# Patient Record
Sex: Female | Born: 1980 | Race: White | Hispanic: No | Marital: Single | Smoking: Former smoker
Health system: Southern US, Community
[De-identification: ages and names within clinical notes are randomized; demographics above are authoritative.]

## PROBLEM LIST (undated history)

## (undated) ENCOUNTER — Emergency Department (HOSPITAL_BASED_OUTPATIENT_CLINIC_OR_DEPARTMENT_OTHER): Admission: EM | Payer: Self-pay | Source: Home / Self Care

## (undated) DIAGNOSIS — Z789 Other specified health status: Secondary | ICD-10-CM

## (undated) HISTORY — PX: OTHER SURGICAL HISTORY: SHX169

## (undated) HISTORY — PX: CHOLECYSTECTOMY: SHX55

## (undated) HISTORY — PX: ABSCESS DRAINAGE: SHX1119

---

## 2010-06-14 ENCOUNTER — Emergency Department (HOSPITAL_COMMUNITY): Admission: EM | Admit: 2010-06-14 | Discharge: 2010-06-14 | Payer: Self-pay | Admitting: Emergency Medicine

## 2010-12-03 LAB — URINALYSIS, ROUTINE W REFLEX MICROSCOPIC
Bilirubin Urine: NEGATIVE
Hgb urine dipstick: NEGATIVE
Nitrite: NEGATIVE
Protein, ur: NEGATIVE mg/dL
Urobilinogen, UA: 1 mg/dL (ref 0.0–1.0)

## 2010-12-03 LAB — GC/CHLAMYDIA PROBE AMP, GENITAL
Chlamydia, DNA Probe: NEGATIVE
GC Probe Amp, Genital: NEGATIVE

## 2010-12-03 LAB — URINE MICROSCOPIC-ADD ON

## 2010-12-03 LAB — WET PREP, GENITAL
Trich, Wet Prep: NONE SEEN
WBC, Wet Prep HPF POC: NONE SEEN
Yeast Wet Prep HPF POC: NONE SEEN

## 2010-12-08 ENCOUNTER — Emergency Department (HOSPITAL_BASED_OUTPATIENT_CLINIC_OR_DEPARTMENT_OTHER)
Admission: EM | Admit: 2010-12-08 | Discharge: 2010-12-09 | Disposition: A | Discharge status: Home / Self Care | Payer: Self-pay | Attending: Emergency Medicine | Admitting: Emergency Medicine

## 2010-12-08 DIAGNOSIS — S01501A Unspecified open wound of lip, initial encounter: Secondary | ICD-10-CM | POA: Insufficient documentation

## 2010-12-08 DIAGNOSIS — X58XXXA Exposure to other specified factors, initial encounter: Secondary | ICD-10-CM | POA: Insufficient documentation

## 2011-01-22 ENCOUNTER — Emergency Department (HOSPITAL_BASED_OUTPATIENT_CLINIC_OR_DEPARTMENT_OTHER)
Admission: EM | Admit: 2011-01-22 | Discharge: 2011-01-22 | Discharge status: Against Medical Advice | Payer: Self-pay | Attending: Emergency Medicine | Admitting: Emergency Medicine

## 2011-01-22 DIAGNOSIS — R509 Fever, unspecified: Secondary | ICD-10-CM | POA: Insufficient documentation

## 2013-11-23 ENCOUNTER — Emergency Department (HOSPITAL_BASED_OUTPATIENT_CLINIC_OR_DEPARTMENT_OTHER)
Admission: EM | Admit: 2013-11-23 | Discharge: 2013-11-23 | Disposition: A | Discharge status: Home / Self Care | Payer: 59 | Attending: Emergency Medicine | Admitting: Emergency Medicine

## 2013-11-23 ENCOUNTER — Encounter (HOSPITAL_BASED_OUTPATIENT_CLINIC_OR_DEPARTMENT_OTHER): Payer: Self-pay | Admitting: Emergency Medicine

## 2013-11-23 DIAGNOSIS — Z3202 Encounter for pregnancy test, result negative: Secondary | ICD-10-CM | POA: Insufficient documentation

## 2013-11-23 DIAGNOSIS — F17200 Nicotine dependence, unspecified, uncomplicated: Secondary | ICD-10-CM | POA: Insufficient documentation

## 2013-11-23 DIAGNOSIS — N390 Urinary tract infection, site not specified: Secondary | ICD-10-CM | POA: Insufficient documentation

## 2013-11-23 LAB — URINALYSIS, ROUTINE W REFLEX MICROSCOPIC
Bilirubin Urine: NEGATIVE
Glucose, UA: NEGATIVE mg/dL
Hgb urine dipstick: NEGATIVE
Ketones, ur: NEGATIVE mg/dL
NITRITE: NEGATIVE
PH: 6 (ref 5.0–8.0)
Protein, ur: NEGATIVE mg/dL
SPECIFIC GRAVITY, URINE: 1.024 (ref 1.005–1.030)
UROBILINOGEN UA: 1 mg/dL (ref 0.0–1.0)

## 2013-11-23 LAB — URINE MICROSCOPIC-ADD ON

## 2013-11-23 LAB — PREGNANCY, URINE: PREG TEST UR: NEGATIVE

## 2013-11-23 MED ORDER — PHENAZOPYRIDINE HCL 200 MG PO TABS: 200.0000 mg | ORAL_TABLET | Freq: Three times a day (TID) | ORAL | Status: DC | PRN

## 2013-11-23 MED ORDER — KETOROLAC TROMETHAMINE 60 MG/2ML IM SOLN
60.0000 mg | Freq: Once | INTRAMUSCULAR | Status: AC
  Administered 2013-11-23: 60 mg via INTRAMUSCULAR
  Filled 2013-11-23: qty 2

## 2013-11-23 MED ORDER — NITROFURANTOIN MONOHYD MACRO 100 MG PO CAPS: 100.0000 mg | ORAL_CAPSULE | Freq: Two times a day (BID) | ORAL | Status: DC

## 2013-11-23 NOTE — ED Notes (Signed)
Pt states that she has had burning for greater than 1 week, thought it was related to menstral cycle, but has not improved

## 2013-11-23 NOTE — ED Provider Notes (Signed)
CSN: [id number]     Arrival date & time 11/23/13  0242 History   First MD Initiated Contact with Patient 11/23/13 0257     Chief Complaint  Patient presents with  . Abdominal Pain  . Dysuria     (Consider location/radiation/quality/duration/timing/severity/associated sxs/prior Treatment) Patient is a 33 y.o. female presenting with abdominal pain and dysuria. The history is provided by the patient.  Abdominal Pain Pain location:  Suprapubic Pain quality: burning   Pain radiates to:  Does not radiate Pain severity:  Moderate Onset quality:  Gradual Timing:  Constant Progression:  Unchanged Chronicity:  New Context: not trauma   Relieved by:  Nothing Worsened by:  Nothing tried Ineffective treatments:  None tried Associated symptoms: dysuria   Associated symptoms: no fever, no hematuria, no vaginal bleeding and no vaginal discharge   Dysuria:    Severity:  Moderate   Onset quality:  Gradual   Timing:  Constant   Progression:  Unchanged   Chronicity:  New Risk factors: pregnancy   Dysuria Associated symptoms: abdominal pain   Associated symptoms: no fever, no flank pain and no vaginal discharge   Also wants migraine treatment and ear pain treatment  History reviewed. No pertinent past medical history. History reviewed. No pertinent past surgical history. History reviewed. No pertinent family history. History  Substance Use Topics  . Smoking status: Current Every Day Smoker  . Smokeless tobacco: Not on file  . Alcohol Use: No   OB History   Grav Para Term Preterm Abortions TAB SAB Ect Mult Living                 Review of Systems  Constitutional: Negative for fever.  Gastrointestinal: Positive for abdominal pain.  Genitourinary: Positive for dysuria. Negative for hematuria, flank pain, vaginal bleeding, vaginal discharge, difficulty urinating and menstrual problem.  All other systems reviewed and are negative.      Allergies  Sulfa antibiotics  Home  Medications  No current outpatient prescriptions on file. BP 128/74  Pulse 102  Temp(Src) 99.1 F (37.3 C) (Oral)  Resp 20  Ht 5\' 4"  (1.626 m)  Wt 158 lb (71.668 kg)  BMI 27.11 kg/m2  SpO2 98%  LMP 11/16/2013 Physical Exam  Constitutional: She appears well-developed and well-nourished. No distress.  Appears intoxicated  HENT:  Head: Normocephalic and atraumatic.  Mouth/Throat: Oropharynx is clear and moist.  Eyes: Conjunctivae and EOM are normal.  Pinpoint pupils  Neck: Normal range of motion.  Cardiovascular: Normal rate, regular rhythm and intact distal pulses.   Pulmonary/Chest: Effort normal and breath sounds normal. She has no wheezes. She has no rales.  Abdominal: Soft. Bowel sounds are normal. There is no tenderness. There is no rebound and no guarding.  Musculoskeletal: Normal range of motion.  Neurological: She is alert. She has normal reflexes.  Skin: Skin is warm and dry.  Psychiatric: She has a normal mood and affect.    ED Course  Procedures (including critical care time) Labs Review Labs Reviewed  PREGNANCY, URINE  URINALYSIS, ROUTINE W REFLEX MICROSCOPIC   Imaging Review No results found.   EKG Interpretation None      MDM   Final diagnoses:  None    Will treat for UTI follow up with your family doctor    Allsion Nogales K Donyelle Enyeart-Rasch, MD 11/23/13 [phone]

## 2013-11-23 NOTE — ED Notes (Signed)
Pt requesting pain medication for migraine, informed patient that stong pain medication could not be given due to fact that she drove herself her and does not have a ride to come and pick her up

## 2014-04-21 ENCOUNTER — Encounter (HOSPITAL_BASED_OUTPATIENT_CLINIC_OR_DEPARTMENT_OTHER): Payer: Self-pay | Admitting: Emergency Medicine

## 2014-04-21 ENCOUNTER — Emergency Department (HOSPITAL_BASED_OUTPATIENT_CLINIC_OR_DEPARTMENT_OTHER)
Admission: EM | Admit: 2014-04-21 | Discharge: 2014-04-21 | Disposition: A | Discharge status: Home / Self Care | Payer: 59 | Attending: Emergency Medicine | Admitting: Emergency Medicine

## 2014-04-21 DIAGNOSIS — R51 Headache: Secondary | ICD-10-CM | POA: Insufficient documentation

## 2014-04-21 DIAGNOSIS — J069 Acute upper respiratory infection, unspecified: Secondary | ICD-10-CM | POA: Diagnosis not present

## 2014-04-21 DIAGNOSIS — J3489 Other specified disorders of nose and nasal sinuses: Secondary | ICD-10-CM | POA: Insufficient documentation

## 2014-04-21 DIAGNOSIS — F17200 Nicotine dependence, unspecified, uncomplicated: Secondary | ICD-10-CM | POA: Insufficient documentation

## 2014-04-21 DIAGNOSIS — Z3202 Encounter for pregnancy test, result negative: Secondary | ICD-10-CM | POA: Insufficient documentation

## 2014-04-21 DIAGNOSIS — J029 Acute pharyngitis, unspecified: Secondary | ICD-10-CM | POA: Diagnosis not present

## 2014-04-21 LAB — RAPID STREP SCREEN (MED CTR MEBANE ONLY): Streptococcus, Group A Screen (Direct): NEGATIVE

## 2014-04-21 LAB — PREGNANCY, URINE: Preg Test, Ur: NEGATIVE

## 2014-04-21 MED ORDER — METOCLOPRAMIDE HCL 5 MG/ML IJ SOLN
5.0000 mg | Freq: Once | INTRAMUSCULAR | Status: AC
  Administered 2014-04-21: 5 mg via INTRAVENOUS
  Filled 2014-04-21: qty 2

## 2014-04-21 MED ORDER — OXYCODONE-ACETAMINOPHEN 5-325 MG PO TABS: 1.0000 | ORAL_TABLET | Freq: Four times a day (QID) | ORAL | Status: DC | PRN

## 2014-04-21 MED ORDER — SODIUM CHLORIDE 0.9 % IV BOLUS (SEPSIS)
1000.0000 mL | INTRAVENOUS | Status: AC
  Administered 2014-04-21: 1000 mL via INTRAVENOUS

## 2014-04-21 MED ORDER — DIPHENHYDRAMINE HCL 50 MG/ML IJ SOLN
25.0000 mg | Freq: Once | INTRAMUSCULAR | Status: AC
  Administered 2014-04-21: 25 mg via INTRAVENOUS
  Filled 2014-04-21: qty 1

## 2014-04-21 MED ORDER — OXYCODONE-ACETAMINOPHEN 5-325 MG PO TABS
1.0000 | ORAL_TABLET | Freq: Once | ORAL | Status: AC
  Administered 2014-04-21: 1 via ORAL
  Filled 2014-04-21: qty 1

## 2014-04-21 MED ORDER — DEXAMETHASONE 4 MG PO TABS
ORAL_TABLET | ORAL | Status: AC
  Administered 2014-04-21: 10 mg via ORAL
  Filled 2014-04-21: qty 3

## 2014-04-21 MED ORDER — DEXAMETHASONE 4 MG PO TABS
10.0000 mg | ORAL_TABLET | Freq: Once | ORAL | Status: AC
  Administered 2014-04-21: 10 mg via ORAL

## 2014-04-21 NOTE — ED Notes (Signed)
Patient here with 4 days of sinus pressure with green sputum, neck swelling and fever. Also reports a lot of drainage going down throat making it burn

## 2014-04-21 NOTE — ED Provider Notes (Signed)
CSN: [id number]     Arrival date & time 04/21/14  1135 History   First MD Initiated Contact with Patient 04/21/14 1340     Chief Complaint  Patient presents with  . Nasal Congestion     (Consider location/radiation/quality/duration/timing/severity/associated sxs/prior Treatment) Patient is a 33 y.o. female presenting with URI. The history is provided by the patient.  URI Presenting symptoms: congestion, fever and sore throat   Presenting symptoms: no cough and no fatigue   Congestion:    Location:  Nasal Fever:    Duration:  3 days   Timing:  Intermittent   Temp source:  Oral   Progression:  Waxing and waning Sore throat:    Severity:  Moderate   Onset quality:  Gradual   Duration:  3 days   Timing:  Constant   Progression:  Unchanged Severity:  Moderate Onset quality:  Gradual Duration:  3 days Timing:  Constant Progression:  Unchanged Chronicity:  New Relieved by:  Nothing Worsened by:  Nothing tried Ineffective treatments:  Hot fluids and OTC medications Associated symptoms: headaches   Associated symptoms: no neck pain     History reviewed. No pertinent past medical history. History reviewed. No pertinent past surgical history. No family history on file. History  Substance Use Topics  . Smoking status: Current Every Day Smoker  . Smokeless tobacco: Not on file  . Alcohol Use: No   OB History   Grav Para Term Preterm Abortions TAB SAB Ect Mult Living                 Review of Systems  Constitutional: Positive for fever. Negative for fatigue.  HENT: Positive for congestion and sore throat. Negative for drooling.   Eyes: Negative for pain.  Respiratory: Negative for cough and shortness of breath.   Cardiovascular: Negative for chest pain.  Gastrointestinal: Negative for nausea, vomiting, abdominal pain and diarrhea.  Genitourinary: Negative for dysuria and hematuria.  Musculoskeletal: Negative for back pain, gait problem and neck pain.  Skin: Negative  for color change.  Neurological: Positive for headaches. Negative for dizziness.  Hematological: Negative for adenopathy.  Psychiatric/Behavioral: Negative for behavioral problems.  All other systems reviewed and are negative.     Allergies  Sulfa antibiotics  Home Medications   Prior to Admission medications   Not on File   BP 95/63  Pulse 106  Temp(Src) 98.4 F (36.9 C) (Oral)  Resp 18  Ht 5\' 4"  (1.626 m)  Wt 158 lb (71.668 kg)  BMI 27.11 kg/m2  SpO2 99% Physical Exam  Nursing note and vitals reviewed. Constitutional: She is oriented to person, place, and time. She appears well-developed and well-nourished.  HENT:  Head: Normocephalic and atraumatic.  Mouth/Throat: Oropharynx is clear and moist. No oropharyngeal exudate.  Normal rom of the neck.   Mild erythema of the posterior oropharynx. No exudate.  Mildly enlarged tonsils bilaterally. No uvular deviation. No trismus.  Mildly prominent anterior cervical adenopathy.  Eyes: Conjunctivae and EOM are normal. Pupils are equal, round, and reactive to light.  Neck: Normal range of motion. Neck supple.  Cardiovascular: Normal rate, regular rhythm, normal heart sounds and intact distal pulses.  Exam reveals no gallop and no friction rub.   No murmur heard. Pulmonary/Chest: Effort normal and breath sounds normal. No respiratory distress. She has no wheezes.  Abdominal: Soft. Bowel sounds are normal. There is no tenderness. There is no rebound and no guarding.  Musculoskeletal: Normal range of motion. She exhibits no edema and  no tenderness.  Neurological: She is alert and oriented to person, place, and time.  alert, oriented x3 speech: normal in context and clarity memory: intact grossly cranial nerves II-XII: intact motor strength: full proximally and distally no involuntary movements or tremors sensation: intact to light touch diffusely  cerebellar: finger-to-nose and heel-to-shin intact gait: normal forwards and  backwards   Skin: Skin is warm and dry.  Psychiatric: She has a normal mood and affect. Her behavior is normal.    ED Course  Procedures (including critical care time) Labs Review Labs Reviewed  RAPID STREP SCREEN  CULTURE, GROUP A STREP  PREGNANCY, URINE    Imaging Review No results found.   EKG Interpretation None      MDM   Final diagnoses:  Viral URI    1:54 PM 33 y.o. female who presents with nasal congestion, fever, headache, and sore throat for the last 4 days. She notes the headache was gradual onset with the other symptoms. She has had fever up to 101 last night. She is afebrile here and mildly tachycardic. Vital signs otherwise unremarkable on my exam. She has a normal neurologic exam. No meningismus. Do no think this is meningitis. Likely viral URI. Will treat her headache with a headache cocktail and reevaluate.   3:50 PM: HA gone. Pt feeling much better  I have discussed the diagnosis/risks/treatment options with the patient and believe the pt to be eligible for discharge home to follow-up with her pcp as needed. We also discussed returning to the ED immediately if new or worsening sx occur. We discussed the sx which are most concerning (e.g., worsening HA, intractable fever) that necessitate immediate return. Medications administered to the patient during their visit and any new prescriptions provided to the patient are listed below.  Medications given during this visit Medications  dexamethasone (DECADRON) tablet 10 mg (not administered)  sodium chloride 0.9 % bolus 1,000 mL (1,000 mLs Intravenous New Bag/Given 04/21/14 1418)  metoCLOPramide (REGLAN) injection 5 mg (5 mg Intravenous Given 04/21/14 1418)  diphenhydrAMINE (BENADRYL) injection 25 mg (25 mg Intravenous Given 04/21/14 1418)  oxyCODONE-acetaminophen (PERCOCET/ROXICET) 5-325 MG per tablet 1 tablet (1 tablet Oral Given 04/21/14 1418)    Discharge Medication List as of 04/21/2014  3:51 PM    START taking  these medications   Details  oxyCODONE-acetaminophen (PERCOCET) 5-325 MG per tablet Take 1 tablet by mouth every 6 (six) hours as needed for moderate pain., Starting 04/21/2014, Until Discontinued, Print         Junius Argyle, MD 04/23/14 [phone]

## 2014-04-24 LAB — CULTURE, GROUP A STREP

## 2014-04-25 ENCOUNTER — Telehealth (HOSPITAL_BASED_OUTPATIENT_CLINIC_OR_DEPARTMENT_OTHER): Payer: Self-pay | Admitting: Emergency Medicine

## 2014-04-25 NOTE — Progress Notes (Signed)
ED Antimicrobial Stewardship Positive Culture Follow Up   Milianna Vicente is an 33 y.o. female who presented to [facility] on 04/21/2014 with a chief complaint of  Chief Complaint  Patient presents with  . Nasal Congestion    Recent Results (from the past 720 hour(s))  RAPID STREP SCREEN     Status: None   Collection Time    04/21/14  2:10 PM      Result Value Ref Range Status   Streptococcus, Group A Screen (Direct) NEGATIVE  NEGATIVE Final   Comment: (NOTE)     A Rapid Antigen test may result negative if the antigen level in the     sample is below the detection level of this test. The FDA has not     cleared this test as a stand-alone test therefore the rapid antigen     negative result has reflexed to a Group A Strep culture.  CULTURE, GROUP A STREP     Status: None   Collection Time    04/21/14  2:10 PM      Result Value Ref Range Status   Specimen Description THROAT   Final   Special Requests NONE   Final   Culture     Final   Value: GROUP A STREP (S.PYOGENES) ISOLATED     Performed at [facility]   Report Status 04/24/2014 FINAL   Final    Patient discharged originally without antimicrobial agent and treatment is now indicated  New antibiotic prescription: Amoxicillin 500 mg tablets by mouth twice daily for 10 days  ED Provider: Barbie Haggis 04/25/2014, 11:43 AM Infectious Diseases Pharmacist Phone# [phone]

## 2014-04-25 NOTE — Telephone Encounter (Signed)
Post ED Visit - Positive Culture Follow-up: Successful Patient Follow-Up  Culture assessed and recommendations reviewed by: []  Wes Dulaney, Pharm.D., BCPS []  Celedonio MiyamotoJeremy Frens, Pharm.D., BCPS []  Georgina PillionElizabeth Martin, Pharm.D., BCPS []  KirbyMinh Pham, [address] Rainbow BoulevardPharm.D., BCPS, AAHIVP []  Estella HuskMichelle Turner, Pharm.D., BCPS, AAHIVP [x]  Red ChristiansSamson Lee, Pharm.D. []  Tennis Mustassie Stewart, [place]arm[place]D.  Positive strep throat culture  [x]  Patient discharged without antimicrobial prescription and treatment is now indicated []  Organism is resistant to prescribed ED discharge antimicrobial []  Patient with positive blood cultures  Changes discussed with ED provider: Meredith StaggersJeffrey Greene [facility] New antibiotic prescription Amoxicillin 500mg  tablets po bid x 10 days [facility]   [facility], date , [facility]  Attempt to notify pt, LVM to call back  Berle MullMiller, Desare Duddy 04/25/2014, 3:56 PM

## 2014-04-27 ENCOUNTER — Telehealth (HOSPITAL_BASED_OUTPATIENT_CLINIC_OR_DEPARTMENT_OTHER): Payer: Self-pay | Admitting: Emergency Medicine

## 2014-05-06 ENCOUNTER — Encounter (HOSPITAL_BASED_OUTPATIENT_CLINIC_OR_DEPARTMENT_OTHER): Payer: Self-pay | Admitting: Emergency Medicine

## 2014-05-06 ENCOUNTER — Emergency Department (HOSPITAL_BASED_OUTPATIENT_CLINIC_OR_DEPARTMENT_OTHER)
Admission: EM | Admit: 2014-05-06 | Discharge: 2014-05-06 | Disposition: A | Discharge status: Home / Self Care | Payer: 59 | Attending: Emergency Medicine | Admitting: Emergency Medicine

## 2014-05-06 DIAGNOSIS — F17200 Nicotine dependence, unspecified, uncomplicated: Secondary | ICD-10-CM | POA: Insufficient documentation

## 2014-05-06 DIAGNOSIS — IMO0001 Reserved for inherently not codable concepts without codable children: Secondary | ICD-10-CM | POA: Insufficient documentation

## 2014-05-06 DIAGNOSIS — J029 Acute pharyngitis, unspecified: Secondary | ICD-10-CM | POA: Diagnosis present

## 2014-05-06 DIAGNOSIS — J020 Streptococcal pharyngitis: Secondary | ICD-10-CM | POA: Diagnosis not present

## 2014-05-06 MED ORDER — HYDROCODONE-ACETAMINOPHEN 7.5-325 MG/15ML PO SOLN
10.0000 mL | Freq: Once | ORAL | Status: AC
  Administered 2014-05-06: 10 mL via ORAL
  Filled 2014-05-06: qty 15

## 2014-05-06 MED ORDER — AMOXICILLIN-POT CLAVULANATE 875-125 MG PO TABS: 1.0000 | ORAL_TABLET | Freq: Two times a day (BID) | ORAL | Status: DC

## 2014-05-06 MED ORDER — DEXAMETHASONE SODIUM PHOSPHATE 10 MG/ML IJ SOLN
10.0000 mg | Freq: Once | INTRAMUSCULAR | Status: AC
  Administered 2014-05-06: 10 mg via INTRAMUSCULAR
  Filled 2014-05-06: qty 1

## 2014-05-06 MED ORDER — AMOXICILLIN-POT CLAVULANATE 875-125 MG PO TABS
1.0000 | ORAL_TABLET | Freq: Once | ORAL | Status: AC
  Administered 2014-05-06: 1 via ORAL
  Filled 2014-05-06: qty 1

## 2014-05-06 MED ORDER — IBUPROFEN 400 MG PO TABS
600.0000 mg | ORAL_TABLET | Freq: Once | ORAL | Status: AC
  Administered 2014-05-06: 600 mg via ORAL
  Filled 2014-05-06 (×2): qty 1

## 2014-05-06 MED ORDER — HYDROCODONE-ACETAMINOPHEN 7.5-325 MG/15ML PO SOLN: 10.0000 mL | ORAL | Status: DC | PRN

## 2014-05-06 NOTE — ED Provider Notes (Signed)
Medical screening examination/treatment/procedure(s) were performed by non-physician practitioner and as supervising physician I was immediately available for consultation/collaboration.   EKG Interpretation None       Doug SouSam Shelina Luo, MD 05/06/14 2330

## 2014-05-06 NOTE — ED Provider Notes (Signed)
CSN: [id number]     Arrival date & time 05/06/14  1759 History   First MD Initiated Contact with Patient 05/06/14 1812     Chief Complaint  Patient presents with  . Sore Throat     (Consider location/radiation/quality/duration/timing/severity/associated sxs/prior Treatment) Patient is a 33 y.o. female presenting with pharyngitis. The history is provided by the patient. No language interpreter was used.  Sore Throat This is a recurrent problem. Associated symptoms include chills, myalgias and a sore throat. Pertinent negatives include no abdominal pain, chest pain, coughing or rash. Associated symptoms comments: She was treated with oral antibiotics on 04/21/14 for strep throat diagnosed in the ED. She states symptoms improved initially, then she started having painful swallowing toward the end of the 10-day regimen. She has been able to drink fluids but states that it is difficult. No vomiting. Marland Kitchen.    History reviewed. No pertinent past medical history. History reviewed. No pertinent past surgical history. History reviewed. No pertinent family history. History  Substance Use Topics  . Smoking status: Current Every Day Smoker  . Smokeless tobacco: Not on file  . Alcohol Use: No   OB History   Grav Para Term Preterm Abortions TAB SAB Ect Mult Living                 Review of Systems  Constitutional: Positive for chills.  HENT: Positive for sore throat. Negative for trouble swallowing.   Respiratory: Negative for cough.   Cardiovascular: Negative for chest pain.  Gastrointestinal: Negative for abdominal pain.  Musculoskeletal: Positive for myalgias.  Skin: Negative for rash.      Allergies  Sulfa antibiotics  Home Medications   Prior to Admission medications   Medication Sig Start Date End Date Taking? Authorizing Provider  oxyCODONE-acetaminophen (PERCOCET) 5-325 MG per tablet Take 1 tablet by mouth every 6 (six) hours as needed for moderate pain. 04/21/14   Purvis SheffieldForrest Harrison,  MD   BP 120/63  Pulse 99  Temp(Src) 99.3 F (37.4 C) (Oral)  Resp 16  Ht 5\' 4"  (1.626 m)  Wt 160 lb (72.576 kg)  BMI 27.45 kg/m2  SpO2 95% Physical Exam  Constitutional: She is oriented to person, place, and time. She appears well-developed and well-nourished. No distress.  HENT:  Mouth/Throat: Oropharyngeal exudate present.  Bilateral tonsillar swelling with exudates. Uvula is midline.   Eyes: Conjunctivae are normal.  Neck: Normal range of motion. Neck supple.  Pulmonary/Chest: Effort normal.  Abdominal: Soft. There is no tenderness.  Lymphadenopathy:    She has cervical adenopathy.  Neurological: She is alert and oriented to person, place, and time.  Skin: Skin is warm and dry. No rash noted.  Psychiatric: She has a normal mood and affect.    ED Course  Procedures (including critical care time) Labs Review Labs Reviewed - No data to display  Imaging Review No results found.   EKG Interpretation None      MDM   Final diagnoses:  None    1. Recurrent strep throat  Records reviewed. Rapid strep negative, positive on culture. Will treat with Augmentin, decadron, pain relief. Refer to ENT if symptoms persist or recur. She is tolerating fluids, heart rate improved. Stable for discharge.     Arnoldo HookerShari A Samhita Kretsch, PA-C 05/06/14 1915

## 2014-05-06 NOTE — ED Notes (Signed)
Pt c/o sore throat x 1 week seen here 7/30 dx strep

## 2014-05-06 NOTE — Discharge Instructions (Signed)

## 2014-07-19 ENCOUNTER — Emergency Department (HOSPITAL_BASED_OUTPATIENT_CLINIC_OR_DEPARTMENT_OTHER)
Admission: EM | Admit: 2014-07-19 | Discharge: 2014-07-19 | Disposition: A | Discharge status: Home / Self Care | Payer: 59 | Attending: Emergency Medicine | Admitting: Emergency Medicine

## 2014-07-19 ENCOUNTER — Encounter (HOSPITAL_BASED_OUTPATIENT_CLINIC_OR_DEPARTMENT_OTHER): Payer: Self-pay | Admitting: Emergency Medicine

## 2014-07-19 DIAGNOSIS — R300 Dysuria: Secondary | ICD-10-CM | POA: Diagnosis present

## 2014-07-19 DIAGNOSIS — Z792 Long term (current) use of antibiotics: Secondary | ICD-10-CM | POA: Insufficient documentation

## 2014-07-19 DIAGNOSIS — N390 Urinary tract infection, site not specified: Secondary | ICD-10-CM | POA: Diagnosis not present

## 2014-07-19 DIAGNOSIS — Z3202 Encounter for pregnancy test, result negative: Secondary | ICD-10-CM | POA: Diagnosis not present

## 2014-07-19 DIAGNOSIS — Z87891 Personal history of nicotine dependence: Secondary | ICD-10-CM | POA: Insufficient documentation

## 2014-07-19 LAB — URINALYSIS, ROUTINE W REFLEX MICROSCOPIC
Glucose, UA: NEGATIVE mg/dL
Ketones, ur: 15 mg/dL — AB
NITRITE: POSITIVE — AB
PROTEIN: 30 mg/dL — AB
SPECIFIC GRAVITY, URINE: 1.02 (ref 1.005–1.030)
UROBILINOGEN UA: 4 mg/dL — AB (ref 0.0–1.0)
pH: 5 (ref 5.0–8.0)

## 2014-07-19 LAB — URINE MICROSCOPIC-ADD ON

## 2014-07-19 LAB — PREGNANCY, URINE: PREG TEST UR: NEGATIVE

## 2014-07-19 MED ORDER — CEPHALEXIN 500 MG PO CAPS: 500.0000 mg | ORAL_CAPSULE | Freq: Four times a day (QID) | ORAL | Status: DC

## 2014-07-19 MED ORDER — PHENAZOPYRIDINE HCL 100 MG PO TABS
200.0000 mg | ORAL_TABLET | Freq: Once | ORAL | Status: AC
  Administered 2014-07-19: 200 mg via ORAL
  Filled 2014-07-19: qty 2

## 2014-07-19 MED ORDER — PHENAZOPYRIDINE HCL 100 MG PO TABS
ORAL_TABLET | ORAL | Status: AC
  Filled 2014-07-19: qty 1

## 2014-07-19 MED ORDER — IBUPROFEN 800 MG PO TABS: 800.0000 mg | ORAL_TABLET | Freq: Three times a day (TID) | ORAL | Status: DC

## 2014-07-19 MED ORDER — CEPHALEXIN 250 MG PO CAPS
500.0000 mg | ORAL_CAPSULE | Freq: Once | ORAL | Status: AC
  Administered 2014-07-19: 500 mg via ORAL
  Filled 2014-07-19: qty 2

## 2014-07-19 NOTE — ED Notes (Signed)
Dysuria x 1 week

## 2014-07-19 NOTE — ED Provider Notes (Signed)
CSN: [id number]     Arrival date & time 07/19/14  1202 History   First MD Initiated Contact with Patient 07/19/14 1206     Chief Complaint  Patient presents with  . Urinary Tract Infection     (Consider location/radiation/quality/duration/timing/severity/associated sxs/prior Treatment) Patient is a 33 y.o. female presenting with urinary tract infection. The history is provided by the patient. No language interpreter was used.  Urinary Tract Infection This is a new problem. The current episode started in the past 7 days. Pertinent negatives include no chills, fever or nausea. Associated symptoms comments: She describes hematuria, painful urination, sense of urgency and urinary frequency for the past 5 days. No vomiting or fever. She denies vaginal discharge. She has had UTI's in the past and reports current symptoms are similar..    No past medical history on file. Past Surgical History  Procedure Laterality Date  . Cholecystectomy    . Arm surgery    . Abscess drainage     No family history on file. History  Substance Use Topics  . Smoking status: Former Smoker    Quit date: 04/18/2014  . Smokeless tobacco: Not on file  . Alcohol Use: No   OB History   Grav Para Term Preterm Abortions TAB SAB Ect Mult Living                 Review of Systems  Constitutional: Negative for fever and chills.  Respiratory: Negative.   Cardiovascular: Negative.   Gastrointestinal: Negative.  Negative for nausea.  Genitourinary: Positive for dysuria, frequency and pelvic pain. Negative for flank pain, vaginal discharge and menstrual problem.  Musculoskeletal: Negative.  Negative for back pain.  Skin: Negative.   Neurological: Negative.       Allergies  Sulfa antibiotics  Home Medications   Prior to Admission medications   Medication Sig Start Date End Date Taking? Authorizing Provider  amoxicillin-clavulanate (AUGMENTIN) 875-125 MG per tablet Take 1 tablet by mouth every 12 (twelve)  hours. 05/06/14   Marnette Perkins A Truth Wolaver, PA-C  HYDROcodone-acetaminophen (HYCET) 7.5-325 mg/15 ml solution Take 10 mLs by mouth every 4 (four) hours as needed for moderate pain or severe pain. 05/06/14   Uzma Hellmer A Latasia Silberstein, PA-C  oxyCODONE-acetaminophen (PERCOCET) 5-325 MG per tablet Take 1 tablet by mouth every 6 (six) hours as needed for moderate pain. 04/21/14   Purvis SheffieldForrest Harrison, MD   BP 99/60  Pulse 96  Temp(Src) 98.1 F (36.7 C) (Oral)  Resp 18  SpO2 99%  LMP 07/19/2014 Physical Exam  Constitutional: She is oriented to person, place, and time. She appears well-developed and well-nourished. No distress.  HENT:  Head: Normocephalic.  Neck: Normal range of motion. Neck supple.  Cardiovascular: Normal rate.   Pulmonary/Chest: Effort normal.  Abdominal: Soft. Bowel sounds are normal. There is no tenderness. There is no rebound and no guarding.  Musculoskeletal: Normal range of motion.  Neurological: She is alert and oriented to person, place, and time.  Skin: Skin is warm and dry. No rash noted.  Psychiatric: She has a normal mood and affect.    ED Course  Procedures (including critical care time) Labs Review Labs Reviewed  URINALYSIS, ROUTINE W REFLEX MICROSCOPIC - Abnormal; Notable for the following:    Color, Urine RED (*)    APPearance CLOUDY (*)    Hgb urine dipstick TRACE (*)    Bilirubin Urine SMALL (*)    Ketones, ur 15 (*)    Protein, ur 30 (*)    Urobilinogen, UA  4.0 (*)    Nitrite POSITIVE (*)    Leukocytes, UA LARGE (*)    All other components within normal limits  URINE MICROSCOPIC-ADD ON - Abnormal; Notable for the following:    Squamous Epithelial / LPF FEW (*)    Bacteria, UA MANY (*)    All other components within normal limits  PREGNANCY, URINE    Imaging Review No results found.   EKG Interpretation None      MDM   Final diagnoses:  None    1. UTI  She is well appearing, non-toxic, without vomiting, with evidence of UTI on UA. Will start on Keflex,  ibuprofen. Stable for discharge.     Arnoldo HookerShari A Arra Connaughton, PA-C 07/19/14 1245

## 2014-07-19 NOTE — Discharge Instructions (Signed)
Urinary Tract Infection °Urinary tract infections (UTIs) can develop anywhere along your urinary tract. Your urinary tract is your body's drainage system for removing wastes and extra water. Your urinary tract includes two kidneys, two ureters, a bladder, and a urethra. Your kidneys are a pair of bean-shaped organs. Each kidney is about the size of your fist. They are located below your ribs, one on each side of your spine. °CAUSES °Infections are caused by microbes, which are microscopic organisms, including fungi, viruses, and bacteria. These organisms are so small that they can only be seen through a microscope. Bacteria are the microbes that most commonly cause UTIs. °SYMPTOMS  °Symptoms of UTIs may vary by age and gender of the patient and by the location of the infection. Symptoms in young women typically include a frequent and intense urge to urinate and a painful, burning feeling in the bladder or urethra during urination. Older women and men are more likely to be tired, shaky, and weak and have muscle aches and abdominal pain. A fever may mean the infection is in your kidneys. Other symptoms of a kidney infection include pain in your back or sides below the ribs, nausea, and vomiting. °DIAGNOSIS °To diagnose a UTI, your caregiver will ask you about your symptoms. Your caregiver also will ask to provide a urine sample. The urine sample will be tested for bacteria and white blood cells. White blood cells are made by your body to help fight infection. °TREATMENT  °Typically, UTIs can be treated with medication. Because most UTIs are caused by a bacterial infection, they usually can be treated with the use of antibiotics. The choice of antibiotic and length of treatment depend on your symptoms and the type of bacteria causing your infection. °HOME CARE INSTRUCTIONS °· If you were prescribed antibiotics, take them exactly as your caregiver instructs you. Finish the medication even if you feel better after you  have only taken some of the medication. °· Drink enough water and fluids to keep your urine clear or pale yellow. °· Avoid caffeine, tea, and carbonated beverages. They tend to irritate your bladder. °· Empty your bladder often. Avoid holding urine for long periods of time. °· Empty your bladder before and after sexual intercourse. °· After a bowel movement, women should cleanse from front to back. Use each tissue only once. °SEEK MEDICAL CARE IF:  °· You have back pain. °· You develop a fever. °· Your symptoms do not begin to resolve within 3 days. °SEEK IMMEDIATE MEDICAL CARE IF:  °· You have severe back pain or lower abdominal pain. °· You develop chills. °· You have nausea or vomiting. °· You have continued burning or discomfort with urination. °MAKE SURE YOU:  °· Understand these instructions. °· Will watch your condition. °· Will get help right away if you are not doing well or get worse. °Document Released: 06/16/2005 Document Revised: 03/07/2012 Document Reviewed: 10/15/2011 °ExitCare® Patient Information ©2015 [employer]. This information is not intended to replace advice given to you by your health care provider. Make sure you discuss any questions you have with your health care provider. ° ° °Emergency Department Resource Guide °1) Find a Doctor and Pay Out of Pocket °Although you won't have to find out who is covered by your insurance plan, it is a good idea to ask around and get recommendations. You will then need to call the office and see if the doctor you have chosen will accept you as a new patient and what types of   options they offer for patients who are self-pay. Some doctors offer discounts or will set up payment plans for their patients who do not have insurance, but you will need to ask so you aren't surprised when you get to your appointment. ° °2) Contact Your Local Health Department °Not all health departments have doctors that can see patients for sick visits, but many do, so it is worth  a call to see if yours does. If you don't know where your local health department is, you can check in your phone book. The CDC also has a tool to help you locate your state's health department, and many state websites also have listings of all of their local health departments. ° °3) Find a Walk-in Clinic °If your illness is not likely to be very severe or complicated, you may want to try a walk in clinic. These are popping up all over the country in pharmacies, drugstores, and shopping centers. They're usually staffed by nurse practitioners or physician assistants that have been trained to treat common illnesses and complaints. They're usually fairly quick and inexpensive. However, if you have serious medical issues or chronic medical problems, these are probably not your best option. ° °No Primary Care Doctor: °- Call [facility] at  832-8000 - they can help you locate a primary care doctor that  accepts your insurance, provides certain services, etc. °- Physician Referral Service- 1-800-533-3463 ° °Chronic Pain Problems: °Organization         Address  Phone   Notes  °[facility]  [phone] Patients need to be referred by their primary care doctor.  ° °Medication Assistance: °Organization         Address  Phone   Notes  °[place] [facility] [address] °[place], [place] [address] [phone]) [phone] --Must be a resident of [place] °-- Must have NO insurance coverage whatsoever (no Medicaid/ Medicare, etc.) °-- The pt. MUST have a primary care doctor that directs their care regularly and follows them in the community °  °[facility]  (866) 331-1348   °[employer]  (888) 892-1162   ° °Agencies that provide inexpensive medical care: °Organization         Address  Phone   Notes  °[facility]  ([phone]   °[facility]    ([phone]) [phone]   °[facility] [address] °[place], [place]  [phone]) [phone]   °[facility] [address], °[place] ([phone]) [phone]   °[facility]    ([phone]) 373-0678   °[facility]    ([phone]) [phone]   °[facility] ° [address], [place] Phone:  ([phone]) [phone], Fax:  [phone] Hours of Operation:  9 am - 6 pm, M-F.  Also accepts Medicaid/Medicare and self-pay.  °[facility] ° [address],  Phone: ([phone]) [phone], Fax: [phone]. Hours of Operation:  8:30 am - 5:30 pm, M-F.  Also accepts Medicaid and self-pay.  °[facility] [place] [address], [place] Phone: ([phone]) [phone]   °[facility] [address], [place], [place] ([phone], Ext. [phone] Mondays & Thursdays: 7-9 AM.  First 15 patients are seen on a first come, first serve basis. °  ° °Medicaid-accepting [place] Providers: ° °Organization         Address  Phone   Notes  °  [facility] [address], [place] [phone] Also accepts self-pay patients.  [facility] [address], [place]  [phone]   [facility] [address], [place] [phone]   [facility] [address] [place] [address], [place] [phone]   Renaye RakersVeita Bland [address], [place]   [phone] Only accepts [place] patients after they have their name applied to their card.   Self-Pay (no insurance) in [facility] [facility]:  Organization         Address  Phone   Notes  Sickle Cell Patients, [facility] [address], [place] [phone]   [facility] [address], [place] [phone]) [phone]   [facility] [place]  [address], [place] [phone]   [facility]/Dr. Osei-Bonsu  [address] [place] [address], [place] or [phone], [place] [phone] Phone number for both [place] and [place] locations is the same.  [facility] [address], [place] [phone]   [facility] [address] [place] [address], [place] or [address] [phone](336) [phone]   [facility] [address], [place] [phone], phone; [phone]) [phone], fax Sees patients 1st and 3rd Saturday of every month.  Must not qualify for public or private insurance (i.e. Medicaid, Medicare, [place] Health Choice, Veterans' Benefits)  Household income should be no more than 200% of the poverty level The clinic cannot treat you if you are pregnant or think you are pregnant  Sexually transmitted diseases are not treated at the clinic.

## 2014-07-19 NOTE — ED Provider Notes (Signed)
Medical screening examination/treatment/procedure(s) were performed by non-physician practitioner and as supervising physician I was immediately available for consultation/collaboration.   EKG Interpretation None        Lashauna Arpin W Earnie Rockhold, MD 07/19/14 1410 

## 2014-08-30 ENCOUNTER — Emergency Department (HOSPITAL_COMMUNITY)
Admission: EM | Admit: 2014-08-30 | Discharge: 2014-08-30 | Disposition: A | Discharge status: Home / Self Care | Payer: 59 | Attending: Emergency Medicine | Admitting: Emergency Medicine

## 2014-08-30 ENCOUNTER — Encounter (HOSPITAL_COMMUNITY): Payer: Self-pay | Admitting: Emergency Medicine

## 2014-08-30 DIAGNOSIS — Z79891 Long term (current) use of opiate analgesic: Secondary | ICD-10-CM | POA: Diagnosis not present

## 2014-08-30 DIAGNOSIS — N390 Urinary tract infection, site not specified: Secondary | ICD-10-CM

## 2014-08-30 DIAGNOSIS — Z3202 Encounter for pregnancy test, result negative: Secondary | ICD-10-CM | POA: Insufficient documentation

## 2014-08-30 DIAGNOSIS — R300 Dysuria: Secondary | ICD-10-CM | POA: Diagnosis present

## 2014-08-30 DIAGNOSIS — Z87891 Personal history of nicotine dependence: Secondary | ICD-10-CM | POA: Insufficient documentation

## 2014-08-30 LAB — URINALYSIS, ROUTINE W REFLEX MICROSCOPIC
BILIRUBIN URINE: NEGATIVE
Glucose, UA: NEGATIVE mg/dL
Ketones, ur: NEGATIVE mg/dL
Nitrite: NEGATIVE
PROTEIN: 30 mg/dL — AB
Specific Gravity, Urine: 1.014 (ref 1.005–1.030)
UROBILINOGEN UA: 0.2 mg/dL (ref 0.0–1.0)
pH: 5.5 (ref 5.0–8.0)

## 2014-08-30 LAB — URINE MICROSCOPIC-ADD ON

## 2014-08-30 LAB — POC URINE PREG, ED: Preg Test, Ur: NEGATIVE

## 2014-08-30 MED ORDER — CEPHALEXIN 500 MG PO CAPS: 500.0000 mg | ORAL_CAPSULE | Freq: Four times a day (QID) | ORAL | Status: DC

## 2014-08-30 MED ORDER — PHENAZOPYRIDINE HCL 200 MG PO TABS: 200.0000 mg | ORAL_TABLET | Freq: Three times a day (TID) | ORAL | Status: DC

## 2014-08-30 NOTE — ED Notes (Signed)
Pt c/o dysuria and UTI sx x several days; pt sts several UTIs recently; pt sts takes methadone

## 2014-08-30 NOTE — ED Notes (Signed)
Pt reports pain, frequency, burning with urination x4days.  Pt reports cramping in bladder.  Pt reports that before this episode began she was going to the bathroom and would have to wait to urinate for 10-20 minutes.  Pt denies fever.  NAD noted at this time.

## 2014-08-30 NOTE — ED Provider Notes (Signed)
CSN: [id number]     Arrival date & time 08/30/14  1117 History   First MD Initiated Contact with Patient 08/30/14 1141     Chief Complaint  Patient presents with  . Urinary Tract Infection     (Consider location/radiation/quality/duration/timing/severity/associated sxs/prior Treatment) Patient is a 33 y.o. female presenting with urinary tract infection. The history is provided by the patient. No language interpreter was used.  Urinary Tract Infection This is a new problem. The current episode started today. The problem occurs constantly. The problem has been gradually worsening. Pertinent negatives include no nausea or vomiting. Nothing aggravates the symptoms. She has tried nothing for the symptoms. The treatment provided moderate relief.  Pt complains of burning and discomfort with urination  History reviewed. No pertinent past medical history. Past Surgical History  Procedure Laterality Date  . Cholecystectomy    . Arm surgery    . Abscess drainage     History reviewed. No pertinent family history. History  Substance Use Topics  . Smoking status: Former Smoker    Quit date: 04/18/2014  . Smokeless tobacco: Not on file  . Alcohol Use: No   OB History    No data available     Review of Systems  Gastrointestinal: Negative for nausea and vomiting.  All other systems reviewed and are negative.     Allergies  Sulfa antibiotics  Home Medications   Prior to Admission medications   Medication Sig Start Date End Date Taking? Authorizing Provider  ibuprofen (ADVIL,MOTRIN) 200 MG tablet Take 800 mg by mouth every 6 (six) hours as needed for moderate pain.   Yes Historical Provider, MD  methadone (DOLOPHINE) 10 MG/ML solution Take 120 mg by mouth daily.   Yes Historical Provider, MD   BP 120/77 mmHg  Pulse 85  Temp(Src) 98.2 F (36.8 C) (Oral)  Resp 18  Ht 5\' 4"  (1.626 m)  Wt 180 lb (81.647 kg)  BMI 30.88 kg/m2  SpO2 97% Physical Exam  Constitutional: She appears  well-developed and well-nourished.  HENT:  Head: Normocephalic and atraumatic.  Right Ear: External ear normal.  Left Ear: External ear normal.  Mouth/Throat: Oropharynx is clear and moist.  Eyes: Pupils are equal, round, and reactive to light.  Neck: Normal range of motion.  Cardiovascular: Normal rate and normal heart sounds.   Pulmonary/Chest: Effort normal.  Abdominal: Soft.  Musculoskeletal: Normal range of motion.  Neurological: She is alert.  Skin: Skin is warm.  Psychiatric: She has a normal mood and affect.  Nursing note and vitals reviewed.   ED Course  Procedures (including critical care time) Labs Review Labs Reviewed  URINALYSIS, ROUTINE W REFLEX MICROSCOPIC - Abnormal; Notable for the following:    APPearance TURBID (*)    Hgb urine dipstick MODERATE (*)    Protein, ur 30 (*)    Leukocytes, UA LARGE (*)    All other components within normal limits  URINE MICROSCOPIC-ADD ON - Abnormal; Notable for the following:    Squamous Epithelial / LPF FEW (*)    Bacteria, UA MANY (*)    All other components within normal limits  POC URINE PREG, ED    Imaging Review No results found.   EKG Interpretation None      MDM   Final diagnoses:  UTI (lower urinary tract infection)    Keflex pyridium    Elson AreasLeslie K Taejon Irani, PA-C 08/30/14 [address]18  Christiaan Strebeck K Green LakeSofia, PA-C 08/30/14 1218  Audree CamelScott T Goldston, MD 08/30/14 [phone]

## 2014-08-30 NOTE — Discharge Instructions (Signed)

## 2014-09-18 ENCOUNTER — Emergency Department (HOSPITAL_BASED_OUTPATIENT_CLINIC_OR_DEPARTMENT_OTHER)
Admission: EM | Admit: 2014-09-18 | Discharge: 2014-09-18 | Disposition: A | Discharge status: Home / Self Care | Payer: 59 | Attending: Emergency Medicine | Admitting: Emergency Medicine

## 2014-09-18 ENCOUNTER — Encounter (HOSPITAL_BASED_OUTPATIENT_CLINIC_OR_DEPARTMENT_OTHER): Payer: Self-pay | Admitting: Emergency Medicine

## 2014-09-18 DIAGNOSIS — Z87891 Personal history of nicotine dependence: Secondary | ICD-10-CM | POA: Diagnosis not present

## 2014-09-18 DIAGNOSIS — Z79899 Other long term (current) drug therapy: Secondary | ICD-10-CM | POA: Insufficient documentation

## 2014-09-18 DIAGNOSIS — Z792 Long term (current) use of antibiotics: Secondary | ICD-10-CM | POA: Insufficient documentation

## 2014-09-18 DIAGNOSIS — L988 Other specified disorders of the skin and subcutaneous tissue: Secondary | ICD-10-CM | POA: Diagnosis not present

## 2014-09-18 DIAGNOSIS — R21 Rash and other nonspecific skin eruption: Secondary | ICD-10-CM | POA: Diagnosis present

## 2014-09-18 MED ORDER — CEPHALEXIN 500 MG PO CAPS: 500.0000 mg | ORAL_CAPSULE | Freq: Four times a day (QID) | ORAL | Status: DC

## 2014-09-18 NOTE — ED Provider Notes (Signed)
CSN: [id number]     Arrival date & time 09/18/14  1535 History   First MD Initiated Contact with Patient 09/18/14 1638     Chief Complaint  Patient presents with  . Wound Check   Paige Malone is a 33 y.o. female who presents the emergency department complaining of 2 small blisters on her left hand that appeared around 4 AM this morning. Patient reports that they do not itch and she has 2 out of 10 pain. Patient denies drainage. The patient reports she was sexually active 2 days ago with female who had a staph infection. Patient does report she has a history of genital herpes. 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 Patient is concerned for MRSA infection. Patient reports she has been going to the tanning bed more frequently over the past 2 weeks. The patient denies fevers, chills, abdominal pain, nausea, vomiting, other lesions or rashes, numbness, tingling, or itching.  (Consider location/radiation/quality/duration/timing/severity/associated sxs/prior Treatment) HPI  No past medical history on file. Past Surgical History  Procedure Laterality Date  . Cholecystectomy    . Arm surgery    . Abscess drainage     No family history on file. History  Substance Use Topics  . Smoking status: Former Smoker    Quit date: 04/18/2014  . Smokeless tobacco: Not on file  . Alcohol Use: No   OB History    No data available     Review of Systems  Constitutional: Negative for fever, chills and appetite change.  HENT: Negative for congestion, ear pain, facial swelling, mouth sores, sore throat and trouble swallowing.   Eyes: Negative for pain and visual disturbance.  Respiratory: Negative for cough, shortness of breath and wheezing.   Cardiovascular: Negative for chest pain and palpitations.  Gastrointestinal: Negative for nausea, vomiting, abdominal pain and diarrhea.  Genitourinary: Negative for dysuria, urgency, frequency, hematuria, flank pain  and genital sores.  Musculoskeletal: Negative for back pain and neck pain.  Skin: Negative for rash.       Blisters to left hand  Neurological: Negative for dizziness, syncope, weakness, numbness and headaches.  All other systems reviewed and are negative.     Allergies  Sulfa antibiotics  Home Medications   Prior to Admission medications   Medication Sig Start Date End Date Taking? Authorizing Provider  cephALEXin (KEFLEX) 500 MG capsule Take 1 capsule (500 mg total) by mouth 4 (four) times daily. 09/18/14   Einar GipWilliam Duncan Shakeerah Gradel, PA-C  methadone (DOLOPHINE) 10 MG/ML solution Take 120 mg by mouth daily.    Historical Provider, MD   BP 128/77 mmHg  Pulse 83  Temp(Src) 98.7 F (37.1 C) (Oral)  Resp 18  Ht 5\' 4"  (1.626 m)  Wt 185 lb (83.915 kg)  BMI 31.74 kg/m2  SpO2 100%  LMP 08/20/2014 Physical Exam  Constitutional: She appears well-developed and well-nourished. No distress.  HENT:  Head: Normocephalic and atraumatic.  Mouth/Throat: Oropharynx is clear and moist.  Eyes: Conjunctivae are normal. Pupils are equal, round, and reactive to light. Right eye exhibits no discharge. Left eye exhibits no discharge.  Neck: Neck supple.  Cardiovascular: Normal rate, regular rhythm, normal heart sounds and intact distal pulses.  Exam reveals no gallop and no friction rub.   No murmur heard. Pulmonary/Chest: Effort normal and breath sounds normal. No respiratory distress. She has no wheezes. She has no rales.  Abdominal: Soft. She exhibits no distension. There is no tenderness.  Musculoskeletal: She exhibits no edema.  Lymphadenopathy:    She has no cervical adenopathy.  Neurological: She is alert. Coordination normal.  Skin: Skin is warm and dry. No rash noted. She is not diaphoretic. No erythema. No pallor.  The patient has 2 very small vesicular blisters noted to her dorsal aspect of her left hand. There is no surrounding erythema or induration. There is no drainage noted. They're  nontender to palpation. There is no bony point tenderness to her underlying hand. There are no other lesions or rashes noted on exam.  Psychiatric: She has a normal mood and affect. Her behavior is normal.  Nursing note and vitals reviewed.   ED Course  Procedures (including critical care time) Labs Review Labs Reviewed - No data to display  Imaging Review No results found.   EKG Interpretation None      Filed Vitals:   09/18/14 1539  BP: 128/77  Pulse: 83  Temp: 98.7 F (37.1 C)  TempSrc: Oral  Resp: 18  Height: 5\' 4"  (1.626 m)  Weight: 185 lb (83.915 kg)  SpO2: 100%     MDM   Meds given in ED:  Medications - No data to display  Discharge Medication List as of 09/18/2014  5:10 PM    START taking these medications   Details  cephALEXin (KEFLEX) 500 MG capsule Take 1 capsule (500 mg total) by mouth 4 (four) times daily., Starting 09/18/2014, Until Discontinued, Print        Final diagnoses:  Rash and nonspecific skin eruption   This patient is a 33 year old female who presented to the emergency department complaining of 2 blisters to the dorsal aspect of her left hand. Patient reports that she was sexually active with her female 2 days ago who had a staph infection and was being treated with antibiotics. The patient reports onset of these blisters around 3 AM this morning and that they are not itchy. There are 2 very small vesicular like lesions to the dorsal aspect of her left hand.There is no surrounding erythema or induration. There is no drainage noted. Will treat this patient for staph infection with Keflex 500 mg 4 times a day for 10 days. Advised patient she needs to follow-up in the next 24-48 hours if her symptoms continue. Advised patient to follow-up with primary care. Advised patient to return to the emergency department with new or worsening symptoms or new concerns. The patient verbalized understanding and agreement with plan.  This patient was discussed  with and evaluated by Dr. Radford PaxBeaton who agrees with assessment and plan.     Lawana ChambersWilliam Duncan Bevan Vu, PA-C 09/18/14 2303  Nelia Shiobert L Beaton, MD 09/18/14 [phone]

## 2014-09-18 NOTE — Discharge Instructions (Signed)
Rash °A rash is a change in the color or texture of your skin. There are many different types of rashes. You may have other problems that accompany your rash. °CAUSES  °· Infections. °· Allergic reactions. This can include allergies to pets or foods. °· Certain medicines. °· Exposure to certain chemicals, soaps, or cosmetics. °· Heat. °· Exposure to poisonous plants. °· Tumors, both cancerous and noncancerous. °SYMPTOMS  °· Redness. °· Scaly skin. °· Itchy skin. °· Dry or cracked skin. °· Bumps. °· Blisters. °· Pain. °DIAGNOSIS  °Your caregiver may do a physical exam to determine what type of rash you have. A skin sample (biopsy) may be taken and examined under a microscope. °TREATMENT  °Treatment depends on the type of rash you have. Your caregiver may prescribe certain medicines. For serious conditions, you may need to see a skin doctor (dermatologist). °HOME CARE INSTRUCTIONS  °· Avoid the substance that caused your rash. °· Do not scratch your rash. This can cause infection. °· You may take cool baths to help stop itching. °· Only take over-the-counter or prescription medicines as directed by your caregiver. °· Keep all follow-up appointments as directed by your caregiver. °SEEK IMMEDIATE MEDICAL CARE IF: °· You have increasing pain, swelling, or redness. °· You have a fever. °· You have new or severe symptoms. °· You have body aches, diarrhea, or vomiting. °· Your rash is not better after 3 days. °MAKE SURE YOU: °· Understand these instructions. °· Will watch your condition. °· Will get help right away if you are not doing well or get worse. °Document Released: 08/27/2002 Document Revised: 11/29/2011 Document Reviewed: 06/21/2011 °ExitCare® Patient Information ©2015 [employer]. This information is not intended to replace advice given to you by your health care provider. Make sure you discuss any questions you have with your health care provider. ° ° °Emergency Department Resource Guide °1) Find a Doctor and  Pay Out of Pocket °Although you won't have to find out who is covered by your insurance plan, it is a good idea to ask around and get recommendations. You will then need to call the office and see if the doctor you have chosen will accept you as a new patient and what types of options they offer for patients who are self-pay. Some doctors offer discounts or will set up payment plans for their patients who do not have insurance, but you will need to ask so you aren't surprised when you get to your appointment. ° °2) Contact Your Local Health Department °Not all health departments have doctors that can see patients for sick visits, but many do, so it is worth a call to see if yours does. If you don't know where your local health department is, you can check in your phone book. The CDC also has a tool to help you locate your state's health department, and many state websites also have listings of all of their local health departments. ° °3) Find a Walk-in Clinic °If your illness is not likely to be very severe or complicated, you may want to try a walk in clinic. These are popping up all over the country in pharmacies, drugstores, and shopping centers. They're usually staffed by nurse practitioners or physician assistants that have been trained to treat common illnesses and complaints. They're usually fairly quick and inexpensive. However, if you have serious medical issues or chronic medical problems, these are probably not your best option. ° °No Primary Care Doctor: °- Call [facility] at  832-8000 -   they can help you locate a primary care doctor that  accepts your insurance, provides certain services, etc. °- Physician Referral Service- 1-800-533-3463 ° °Chronic Pain Problems: °Organization         Address  Phone   Notes  °[facility]  (336) 297-2271 Patients need to be referred by their primary care doctor.  ° °Medication Assistance: °Organization         Address  Phone   Notes  °[place]  [facility] [address] °[place], [place] [address] (336) 641-8030 --Must be a resident of [place] °-- Must have NO insurance coverage whatsoever (no Medicaid/ Medicare, etc.) °-- The pt. MUST have a primary care doctor that directs their care regularly and follows them in the community °  °[facility]  (866) 331-1348   °[employer]  (888) 892-1162   ° °Agencies that provide inexpensive medical care: °Organization         Address  Phone   Notes  °[facility]  (336) 832-8035   °[facility]    (336) 832-7272   °[facility] [address] °[place], [place] [address] (336) 832-4777   °[facility] [address], °[place] (336) 271-4999   °[facility]    (336) 373-0678   °[facility]    (336) 272-1050   °[facility] ° [address], [place] Phone:  (336) 832-4444, Fax:  (336) 832-4440 Hours of Operation:  9 am - 6 pm, M-F.  Also accepts Medicaid/Medicare and self-pay.  °[facility] ° [address], [place] Phone: (336) 832-3150, Fax: (336) 832-3151. Hours of Operation:  8:30 am - 5:30 pm, M-F.  Also accepts Medicaid and self-pay.  °[facility] [place] [address], [place] Phone: (336) 878-6027   °[facility] [address], [place], [place] (336)723-1848, Ext. 123 Mondays & Thursdays: 7-9 AM.  First 15 patients are seen on a first come, first serve basis. °  ° °Medicaid-accepting [place] Providers: ° °Organization         Address  Phone   Notes  °[facility] [address], [place] (336) 641-2100 Also accepts self-pay patients.  °[facility] [address], [place] ° (336) 856-9996   °[facility] [address], [place] (336) 288-8857   °[facility] [address] [place] [address], [place] (336) 299-7000   °Veita Bland [address], [place]  ° (336) 373-1557 Only accepts [place] Access Medicaid patients after they have their name applied to their card.  ° °Self-Pay (no insurance) in [place]: ° °Organization         Address  Phone   Notes  °Sickle Cell Patients, [facility] [address], [place] (336) 832-1970   °[facility] [address], [place] (336) 832-4400   °[facility] [place] ° [address], [place] (336) 992-4800   °[facility]/Dr. Osei-Bonsu ° [address] [place] [address], [place] or [address], [place] (336) 841-8500 Phone number for both [place] and [place] locations is the same.  °[facility] [address], [place] (336) 299-0000   °[facility] [address] [place] [address], [place] or [address] (336) 852-7530 °(336) 878-2260   °[facility] [address], [place] (336) 350-1642, phone; (336) 294-5005, fax Sees patients 1st and 3rd Saturday of every month.  Must not qualify for public or private insurance (i.e. Medicaid, Medicare, [place] Health Choice, Veterans' Benefits) • Household income should be no more than 200% of the poverty level •The clinic cannot treat you if you are pregnant or think you are pregnant • Sexually transmitted diseases are not treated at the clinic.  ° ° °Dental Care: °Organization         Address  Phone  Notes  °[facility] [address], [place] (336) 641-6152 Accepts children up to age 21 who are enrolled in Medicaid or [place] Health Choice; pregnant women with a Medicaid card; and children who have applied for Medicaid or [place] Health Choice, but were declined, whose parents can pay a reduced fee at time of service.  °[facility] [place]  [address], [place] (336) 641-7733 Accepts children up to age 21 who are enrolled in Medicaid or [place] Health Choice; pregnant women with a Medicaid card; and children who have applied for Medicaid or [place] Health Choice, but were declined, whose parents can pay a reduced fee at time of service.  °[facility] ° [address], [place] (336) 641-4533 Patients are seen by appointment only. Walk-ins are not accepted. [facility] will see patients 18 years of age and older. °Monday - Tuesday (8am-5pm) °Most Wednesdays (8:30-5pm) °$30 per visit, cash only  °[facility] ° [address], [place] (336) 641-4533 Patients are seen by appointment only. Walk-ins are not accepted. [facility] will see patients 18 years of age and older. °One Wednesday Evening (Monthly: Volunteer Based).  $30 per visit, cash only  °[facility]  (919) 537-3737 for adults; Children under age 4, call Graduate Pediatric Dentistry at ([phone]) [phone]. Children aged 4-14, please call ([phone]) [phone] to request a pediatric application. ° Dental services are provided in all areas of dental care including fillings, crowns and bridges, complete and partial dentures, implants, gum treatment, root canals, and extractions. Preventive care is also provided. Treatment is provided to both adults and children. °Patients are selected via a lottery and there is often a waiting list. °  °[facility] [address], °[place] ° (336) 763-8833 [email or web address] °  °[facility] [address], [place], [place] (336)723-1848, Ext. 123 Second and Fourth Thursday of each month, opens at 6:30 AM; Clinic ends at 9 AM.  Patients are seen on a first-come first-served basis, and a limited number are seen during each clinic.  ° °[facility] ° [address], [place], [place] (336) 723-7904   Eligibility Requirements °You must have lived in [place], or  [place] counties for at least the last three months. °  You cannot be eligible for state or federal sponsored healthcare insurance, including Veterans Administration, Medicaid, or Medicare. °  You generally cannot be eligible for healthcare insurance through your employer.  °  How to apply: °Eligibility screenings are held every Tuesday and Wednesday afternoon from 1:00 pm until 4:00 pm. You do not need an appointment for the interview!  °[facility] [address], [place], [place] 336-631-2330   °[facility]  336-342-8273   °[place] [facility]  336-703-3100   °[facility]  336-570-6415   ° °Behavioral Health Resources in the Community: °Intensive Outpatient Programs °Organization         Address  Phone  Notes  °[facility] [address], [place], [place] 336-878-6098   °[facility] [address], [place], [place] 336-832-9800   °[facility] [address], [place], [place] ° 336-882-2125   °[facility] [address],  °[place], [place] 1-800-853-5163 or 336-641-4981   °Substance Abuse Resources °Organization         Address  Phone  Notes  °[facility]  336-882-2125   °[facility]  336-784-9470   °[facility]  336-285-9073   °[facility]  336-845-3988   °Residential & Outpatient Substance Abuse Program  1-800-659-3381   °Psychological Services °Organization         Address  Phone  Notes  °[facility]  336- 832-9600   °[facility]  336- 378-7881   °[facility] [address], [place] 1-800-853-5163 or 336-641-4981   ° °Mobile Crisis Teams °Organization         Address  Phone  Notes  °[facility], Mobile Crisis Care Unit  1-877-626-1772   °Assertive °[facility] ° [address] [place], [place] 336-834-9664   °Sharon DeEsch [address] °[place] 336-554-5454   ° °Self-Help/Support Groups °Organization         Address  Phone             Notes  °[facility] - variety of support groups  336- 373-1402 Call for more information  °Narcotics Anonymous (NA), [facility] [address], °[place]  2 meetings at this location  ° °Residential Treatment Programs °Organization         Address  Phone  Notes  °[facility] [address],    °[place]  1-866-801-8205   °[facility] ° [address], [place], [place] 704-293-8524   °[facility] [address], [place] 336-845-3988 Admissions: 8am-3pm M-F  °[facility] [address]    °[place], [place] 336-841-1104   °[facility] [address], [place], [place], [place] 336-379-7146   °[facility] [address]  °[place], [place] 336-285-9073   °[facility] Intensive Outpatient [address], [place], [place] 336-852-3033   °[facility] [address]  °[place], [place] 1-877-615-2722 or 336-784-9470   °[facility] [address] [place], [place] 336-227-7417 Accepts Medicaid  °[facility] [address]  ° [place] 1-800-659-3381 Substance Abuse/Addiction Treatment  ° °[place] Behavioral Health Resources °Organization         Address  Phone  Notes  °[facility]  (888) 581-9988   °Julie Brannon, PhD [address] [place], [place]   (336) 349-5553 or (336) 951-0000   °[facility]   [address] °[place], [place] (336) 349-4454   °[facility] [address], [place], [place] (336) 342-8316 Insurance/Medicaid/sponsorship through [facility]  °[facility] [address]                                      [place], [place] (336) 342-8316 Therapy/tele-psych/case  °[facility] [address]  ° [place], [place] (336) 349-2233     °Dr. Arfeen  (336) 349-4544   °[facility] [place]  [employer] [place] [facility] 1) [address], [place] °2) [address], [place] °3)  [address], [place] (336) 349-3220 °(336) 342-7768 ° °(336) 342-8140   °[place] [facility] (336) 342-1394 or (336) 342-3537 (After Hours)    ° ° ° °

## 2014-09-18 NOTE — ED Notes (Signed)
MD at bedside. 

## 2014-09-18 NOTE — ED Notes (Signed)
Pt has blisters to left hand, noticed it yesterday.  Pt concerned that it may be infection.  ([facility]SA)

## 2016-04-07 ENCOUNTER — Encounter (HOSPITAL_COMMUNITY): Payer: Self-pay | Admitting: *Deleted

## 2016-04-07 ENCOUNTER — Inpatient Hospital Stay (HOSPITAL_COMMUNITY)
Admission: AD | Admit: 2016-04-07 | Discharge: 2016-04-07 | Disposition: A | Discharge status: Home / Self Care | Payer: Self-pay | Source: Ambulatory Visit | Attending: Obstetrics & Gynecology | Admitting: Obstetrics & Gynecology

## 2016-04-07 DIAGNOSIS — Z87891 Personal history of nicotine dependence: Secondary | ICD-10-CM | POA: Insufficient documentation

## 2016-04-07 DIAGNOSIS — M545 Low back pain: Secondary | ICD-10-CM | POA: Insufficient documentation

## 2016-04-07 DIAGNOSIS — M5136 Other intervertebral disc degeneration, lumbar region: Secondary | ICD-10-CM | POA: Insufficient documentation

## 2016-04-07 DIAGNOSIS — R42 Dizziness and giddiness: Secondary | ICD-10-CM | POA: Insufficient documentation

## 2016-04-07 DIAGNOSIS — M5441 Lumbago with sciatica, right side: Secondary | ICD-10-CM

## 2016-04-07 DIAGNOSIS — Z9049 Acquired absence of other specified parts of digestive tract: Secondary | ICD-10-CM | POA: Insufficient documentation

## 2016-04-07 DIAGNOSIS — K529 Noninfective gastroenteritis and colitis, unspecified: Secondary | ICD-10-CM

## 2016-04-07 DIAGNOSIS — R112 Nausea with vomiting, unspecified: Secondary | ICD-10-CM | POA: Insufficient documentation

## 2016-04-07 DIAGNOSIS — R197 Diarrhea, unspecified: Secondary | ICD-10-CM | POA: Insufficient documentation

## 2016-04-07 DIAGNOSIS — Z882 Allergy status to sulfonamides status: Secondary | ICD-10-CM | POA: Insufficient documentation

## 2016-04-07 DIAGNOSIS — Z114 Encounter for screening for human immunodeficiency virus [HIV]: Secondary | ICD-10-CM | POA: Insufficient documentation

## 2016-04-07 HISTORY — DX: Other specified health status: Z78.9

## 2016-04-07 LAB — URINALYSIS, ROUTINE W REFLEX MICROSCOPIC
Bilirubin Urine: NEGATIVE
Glucose, UA: NEGATIVE mg/dL
Hgb urine dipstick: NEGATIVE
Ketones, ur: NEGATIVE mg/dL
LEUKOCYTES UA: NEGATIVE
Nitrite: NEGATIVE
PROTEIN: NEGATIVE mg/dL
Specific Gravity, Urine: 1.025 (ref 1.005–1.030)
pH: 6 (ref 5.0–8.0)

## 2016-04-07 LAB — CBC WITH DIFFERENTIAL/PLATELET
Basophils Absolute: 0 10*3/uL (ref 0.0–0.1)
Basophils Relative: 0 %
Eosinophils Absolute: 0.1 10*3/uL (ref 0.0–0.7)
Eosinophils Relative: 1 %
HCT: 38.4 % (ref 36.0–46.0)
Hemoglobin: 13.4 g/dL (ref 12.0–15.0)
LYMPHS PCT: 26 %
Lymphs Abs: 1.9 10*3/uL (ref 0.7–4.0)
MCH: 28.8 pg (ref 26.0–34.0)
MCHC: 34.9 g/dL (ref 30.0–36.0)
MCV: 82.6 fL (ref 78.0–100.0)
Monocytes Absolute: 0.5 10*3/uL (ref 0.1–1.0)
Monocytes Relative: 6 %
NEUTROS ABS: 4.9 10*3/uL (ref 1.7–7.7)
Neutrophils Relative %: 66 %
PLATELETS: 234 10*3/uL (ref 150–400)
RBC: 4.65 MIL/uL (ref 3.87–5.11)
RDW: 12.8 % (ref 11.5–15.5)
WBC: 7.4 10*3/uL (ref 4.0–10.5)

## 2016-04-07 LAB — COMPREHENSIVE METABOLIC PANEL
ALBUMIN: 4 g/dL (ref 3.5–5.0)
ALT: 16 U/L (ref 14–54)
AST: 20 U/L (ref 15–41)
Alkaline Phosphatase: 41 U/L (ref 38–126)
Anion gap: 4 — ABNORMAL LOW (ref 5–15)
BUN: 8 mg/dL (ref 6–20)
CALCIUM: 9 mg/dL (ref 8.9–10.3)
CHLORIDE: 101 mmol/L (ref 101–111)
CO2: 28 mmol/L (ref 22–32)
Creatinine, Ser: 0.66 mg/dL (ref 0.44–1.00)
GFR calc Af Amer: >60 mL/min (ref >60)
Glucose, Bld: 91 mg/dL (ref 65–99)
Potassium: 4.4 mmol/L (ref 3.5–5.1)
Sodium: 133 mmol/L — ABNORMAL LOW (ref 135–145)
Total Bilirubin: 0.7 mg/dL (ref 0.3–1.2)
Total Protein: 7.2 g/dL (ref 6.5–8.1)

## 2016-04-07 LAB — HEPATITIS B SURFACE ANTIGEN: HEP B S AG: NEGATIVE

## 2016-04-07 MED ORDER — ONDANSETRON 8 MG PO TBDP
8.0000 mg | ORAL_TABLET | Freq: Once | ORAL | Status: AC
  Administered 2016-04-07: 8 mg via ORAL
  Filled 2016-04-07: qty 1

## 2016-04-07 MED ORDER — CYCLOBENZAPRINE HCL 10 MG PO TABS: 10.0000 mg | ORAL_TABLET | Freq: Three times a day (TID) | ORAL | Status: DC | PRN

## 2016-04-07 MED ORDER — ONDANSETRON HCL 4 MG PO TABS: 4.0000 mg | ORAL_TABLET | Freq: Three times a day (TID) | ORAL | Status: DC | PRN

## 2016-04-07 NOTE — Discharge Instructions (Signed)
Chronic Back Pain  When back pain lasts longer than 3 months, it is called chronic back pain.People with chronic back pain often go through certain periods that are more intense (flare-ups).  CAUSES Chronic back pain can be caused by wear and tear (degeneration) on different structures in your back. These structures include:  The bones of your spine (vertebrae) and the joints surrounding your spinal cord and nerve roots (facets).  The strong, fibrous tissues that connect your vertebrae (ligaments). Degeneration of these structures may result in pressure on your nerves. This can lead to constant pain. HOME CARE INSTRUCTIONS  Avoid bending, heavy lifting, prolonged sitting, and activities which make the problem worse.  Take brief periods of rest throughout the day to reduce your pain. Lying down or standing usually is better than sitting while you are resting.  Take over-the-counter or prescription medicines only as directed by your caregiver. SEEK IMMEDIATE MEDICAL CARE IF:   You have weakness or numbness in one of your legs or feet.  You have trouble controlling your bladder or bowels.  You have nausea, vomiting, abdominal pain, shortness of breath, or fainting.   This information is not intended to replace advice given to you by your health care provider. Make sure you discuss any questions you have with your health care provider.   Document Released: 10/14/2004 Document Revised: 11/29/2011 Document Reviewed: 02/24/2015 Elsevier Interactive Patient Education 2016 [employer]  Back Exercises The following exercises strengthen the muscles that help to support the back. They also help to keep the lower back flexible. Doing these exercises can help to prevent back pain or lessen existing pain. If you have back pain or discomfort, try doing these exercises 2-3 times each day or as told by your health care provider. When the pain goes away, do them once each day, but increase the number  of times that you repeat the steps for each exercise (do more repetitions). If you do not have back pain or discomfort, do these exercises once each day or as told by your health care provider. EXERCISES Single Knee to Chest Repeat these steps 3-5 times for each leg:  Lie on your back on a firm bed or the floor with your legs extended.  Bring one knee to your chest. Your other leg should stay extended and in contact with the floor.  Hold your knee in place by grabbing your knee or thigh.  Pull on your knee until you feel a gentle stretch in your lower back.  Hold the stretch for 10-30 seconds.  Slowly release and straighten your leg. Pelvic Tilt Repeat these steps 5-10 times:  Lie on your back on a firm bed or the floor with your legs extended.  Bend your knees so they are pointing toward the ceiling and your feet are flat on the floor.  Tighten your lower abdominal muscles to press your lower back against the floor. This motion will tilt your pelvis so your tailbone points up toward the ceiling instead of pointing to your feet or the floor.  With gentle tension and even breathing, hold this position for 5-10 seconds. Cat-Cow Repeat these steps until your lower back becomes more flexible:  Get into a hands-and-knees position on a firm surface. Keep your hands under your shoulders, and keep your knees under your hips. You may place padding under your knees for comfort.  Let your head hang down, and point your tailbone toward the floor so your lower back becomes rounded like the back  of a cat.  Hold this position for 5 seconds.  Slowly lift your head and point your tailbone up toward the ceiling so your back forms a sagging arch like the back of a cow.  Hold this position for 5 seconds. Press-Ups Repeat these steps 5-10 times:  Lie on your abdomen (face-down) on the floor.  Place your palms near your head, about shoulder-width apart.  While you keep your back as relaxed as  possible and keep your hips on the floor, slowly straighten your arms to raise the top half of your body and lift your shoulders. Do not use your back muscles to raise your upper torso. You may adjust the placement of your hands to make yourself more comfortable.  Hold this position for 5 seconds while you keep your back relaxed.  Slowly return to lying flat on the floor. Bridges Repeat these steps 10 times: 1. Lie on your back on a firm surface. 2. Bend your knees so they are pointing toward the ceiling and your feet are flat on the floor. 3. Tighten your buttocks muscles and lift your buttocks off of the floor until your waist is at almost the same height as your knees. You should feel the muscles working in your buttocks and the back of your thighs. If you do not feel these muscles, slide your feet 1-2 inches farther away from your buttocks. 4. Hold this position for 3-5 seconds. 5. Slowly lower your hips to the starting position, and allow your buttocks muscles to relax completely. If this exercise is too easy, try doing it with your arms crossed over your chest. Abdominal Crunches Repeat these steps 5-10 times: 1. Lie on your back on a firm bed or the floor with your legs extended. 2. Bend your knees so they are pointing toward the ceiling and your feet are flat on the floor. 3. Cross your arms over your chest. 4. Tip your chin slightly toward your chest without bending your neck. 5. Tighten your abdominal muscles and slowly raise your trunk (torso) high enough to lift your shoulder blades a tiny bit off of the floor. Avoid raising your torso higher than that, because it can put too much stress on your low back and it does not help to strengthen your abdominal muscles. 6. Slowly return to your starting position. Back Lifts Repeat these steps 5-10 times: 1. Lie on your abdomen (face-down) with your arms at your sides, and rest your forehead on the floor. 2. Tighten the muscles in your  legs and your buttocks. 3. Slowly lift your chest off of the floor while you keep your hips pressed to the floor. Keep the back of your head in line with the curve in your back. Your eyes should be looking at the floor. 4. Hold this position for 3-5 seconds. 5. Slowly return to your starting position. SEEK MEDICAL CARE IF:  Your back pain or discomfort gets much worse when you do an exercise.  Your back pain or discomfort does not lessen within 2 hours after you exercise. If you have any of these problems, stop doing these exercises right away. Do not do them again unless your health care provider says that you can. SEEK IMMEDIATE MEDICAL CARE IF:  You develop sudden, severe back pain. If this happens, stop doing the exercises right away. Do not do them again unless your health care provider says that you can.   This information is not intended to replace advice given to you by  your health care provider. Make sure you discuss any questions you have with your health care provider.   Document Released: 10/14/2004 Document Revised: 05/28/2015 Document Reviewed: 10/31/2014 Elsevier Interactive Patient Education 2016 [employer].  Viral Gastroenteritis Viral gastroenteritis is also known as stomach flu. This condition affects the stomach and intestinal tract. It can cause sudden diarrhea and vomiting. The illness typically lasts 3 to 8 days. Most people develop an immune response that eventually gets rid of the virus. While this natural response develops, the virus can make you quite ill. CAUSES  Many different viruses can cause gastroenteritis, such as rotavirus or noroviruses. You can catch one of these viruses by consuming contaminated food or water. You may also catch a virus by sharing utensils or other personal items with an infected person or by touching a contaminated surface. SYMPTOMS  The most common symptoms are diarrhea and vomiting. These problems can cause a severe loss of body  fluids (dehydration) and a body salt (electrolyte) imbalance. Other symptoms may include:  Fever.  Headache.  Fatigue.  Abdominal pain. DIAGNOSIS  Your caregiver can usually diagnose viral gastroenteritis based on your symptoms and a physical exam. A stool sample may also be taken to test for the presence of viruses or other infections. TREATMENT  This illness typically goes away on its own. Treatments are aimed at rehydration. The most serious cases of viral gastroenteritis involve vomiting so severely that you are not able to keep fluids down. In these cases, fluids must be given through an intravenous line (IV). HOME CARE INSTRUCTIONS   Drink enough fluids to keep your urine clear or pale yellow. Drink small amounts of fluids frequently and increase the amounts as tolerated.  Ask your caregiver for specific rehydration instructions.  Avoid:  Foods high in sugar.  Alcohol.  Carbonated drinks.  Tobacco.  Juice.  Caffeine drinks.  Extremely hot or cold fluids.  Fatty, greasy foods.  Too much intake of anything at one time.  Dairy products until 24 to 48 hours after diarrhea stops.  You may consume probiotics. Probiotics are active cultures of beneficial bacteria. They may lessen the amount and number of diarrheal stools in adults. Probiotics can be found in yogurt with active cultures and in supplements.  Wash your hands well to avoid spreading the virus.  Only take over-the-counter or prescription medicines for pain, discomfort, or fever as directed by your caregiver. Do not give aspirin to children. Antidiarrheal medicines are not recommended.  Ask your caregiver if you should continue to take your regular prescribed and over-the-counter medicines.  Keep all follow-up appointments as directed by your caregiver. SEEK IMMEDIATE MEDICAL CARE IF:   You are unable to keep fluids down.  You do not urinate at least once every 6 to 8 hours.  You develop shortness of  breath.  You notice blood in your stool or vomit. This may look like coffee grounds.  You have abdominal pain that increases or is concentrated in one small area (localized).  You have persistent vomiting or diarrhea.  You have a fever.  The patient is a child younger than 3 months, and he or she has a fever.  The patient is a child older than 3 months, and he or she has a fever and persistent symptoms.  The patient is a child older than 3 months, and he or she has a fever and symptoms suddenly get worse.  The patient is a baby, and he or she has no tears when crying.  MAKE SURE YOU:   Understand these instructions.  Will watch your condition.  Will get help right away if you are not doing well or get worse.   This information is not intended to replace advice given to you by your health care provider. Make sure you discuss any questions you have with your health care provider.   Document Released: 09/06/2005 Document Revised: 11/29/2011 Document Reviewed: 06/23/2011 Elsevier Interactive Patient Education [employer]

## 2016-04-07 NOTE — MAU Provider Note (Signed)
Chief Complaint:  No chief complaint on file.   First Provider Initiated Contact with Patient 04/07/16 1414      Paige Malone is a 35 y.o. No obstetric history on file. who presents to maternity admissions reporting nausea, vomiting and back pain for 2 days.  Maybe fever.  Also had a 2 week long episode of N/V/D 2 weeks ago. Thought it might be due to her Methadone, though her dosage and intake has been consistent with no dosages missed or changed.  Has never been tested for hepatitis.  Did have a cholecystectomy but still has her appendix   Does have a history of degenerative disk disease in L5 from an MRI years ago.  Back pain is worse with leg lifting.  . She reports vaginal bleeding, vaginal itching/burning, urinary symptoms, h/a, dizziness, n/v, or fever/chills.     Emesis  This is a recurrent problem. The current episode started yesterday. The problem occurs 2 to 4 times per day. The problem has been unchanged. There has been no fever. Associated symptoms include diarrhea. Pertinent negatives include no abdominal pain, chills, dizziness, fever or myalgias. Risk factors: No ill contacts.   Past Medical History: Past Medical History  Diagnosis Date  . Medical history non-contributory     Past obstetric history: OB History  No data available    Past Surgical History: Past Surgical History  Procedure Laterality Date  . Cholecystectomy    . Arm surgery    . Abscess drainage      Family History: History reviewed. No pertinent family history.  Social History: Social History  Substance Use Topics  . Smoking status: Former Smoker    Quit date: 04/18/2014  . Smokeless tobacco: None  . Alcohol Use: No    Allergies:  Allergies  Allergen Reactions  . Sulfa Antibiotics Hives    Meds:  Prescriptions prior to admission  Medication Sig Dispense Refill Last Dose  . cephALEXin (KEFLEX) 500 MG capsule Take 1 capsule (500 mg total) by mouth 4 (four) times daily. 20 capsule  0   . methadone (DOLOPHINE) 10 MG/ML solution Take 120 mg by mouth daily.   08/30/2014 at Unknown time    I have reviewed patient's Past Medical Hx, Surgical Hx, Family Hx, Social Hx, medications and allergies.  ROS:  Review of Systems  Constitutional: Negative for fever, chills and fatigue.  Gastrointestinal: Positive for vomiting and diarrhea. Negative for abdominal pain.  Musculoskeletal: Positive for back pain. Negative for myalgias.  Neurological: Negative for dizziness.   Other systems negative     Physical Exam  Patient Vitals for the past 24 hrs:  BP Temp Pulse Resp  04/07/16 1348 99/62 mmHg 97.9 F (36.6 C) 92 16   Constitutional: Well-developed, well-nourished female in no acute distress, but appears ill.  Cardiovascular: normal rate and rhythm, no ectopy audible, S1 & S2 heard, no murmur Respiratory: normal effort, no distress. Lungs CTAB with no wheezes or crackles GI: Abd soft, non-tender.  Nondistended.  No rebound, No guarding.  Bowel Sounds audible  MS: Extremities nontender, no edema, normal ROM    + straight leg lift pain on right, tender over low back Neurologic: Alert and oriented x 4.   Grossly nonfocal. GU: Neg CVAT. Skin:  Warm and Dry Psych:  Affect appropriate.  Declines pelvic exam   Labs: Results for orders placed or performed during the hospital encounter of 04/07/16 (from the past 24 hour(s))  Urinalysis, Routine w reflex microscopic (not at [facility])  Status: None   Collection Time: 04/07/16  1:45 PM  Result Value Ref Range   Color, Urine YELLOW YELLOW   APPearance CLEAR CLEAR   Specific Gravity, Urine 1.025 1.005 - 1.030   pH 6.0 5.0 - 8.0   Glucose, UA NEGATIVE NEGATIVE mg/dL   Hgb urine dipstick NEGATIVE NEGATIVE   Bilirubin Urine NEGATIVE NEGATIVE   Ketones, ur NEGATIVE NEGATIVE mg/dL   Protein, ur NEGATIVE NEGATIVE mg/dL   Nitrite NEGATIVE NEGATIVE   Leukocytes, UA NEGATIVE NEGATIVE  CBC with Differential/Platelet     Status: None    Collection Time: 04/07/16  2:39 PM  Result Value Ref Range   WBC 7.4 4.0 - 10.5 K/uL   RBC 4.65 3.87 - 5.11 MIL/uL   Hemoglobin 13.4 12.0 - 15.0 g/dL   HCT 95.2 84.1 - 32.4 %   MCV 82.6 78.0 - 100.0 fL   MCH 28.8 26.0 - 34.0 pg   MCHC 34.9 30.0 - 36.0 g/dL   RDW 40.1 02.7 - 25.3 %   Platelets 234 150 - 400 K/uL   Neutrophils Relative % 66 %   Neutro Abs 4.9 1.7 - 7.7 K/uL   Lymphocytes Relative 26 %   Lymphs Abs 1.9 0.7 - 4.0 K/uL   Monocytes Relative 6 %   Monocytes Absolute 0.5 0.1 - 1.0 K/uL   Eosinophils Relative 1 %   Eosinophils Absolute 0.1 0.0 - 0.7 K/uL   Basophils Relative 0 %   Basophils Absolute 0.0 0.0 - 0.1 K/uL  Comprehensive metabolic panel     Status: Abnormal   Collection Time: 04/07/16  2:39 PM  Result Value Ref Range   Sodium 133 (L) 135 - 145 mmol/L   Potassium 4.4 3.5 - 5.1 mmol/L   Chloride 101 101 - 111 mmol/L   CO2 28 22 - 32 mmol/L   Glucose, Bld 91 65 - 99 mg/dL   BUN 8 6 - 20 mg/dL   Creatinine, Ser 6.64 0.44 - 1.00 mg/dL   Calcium 9.0 8.9 - 40.3 mg/dL   Total Protein 7.2 6.5 - 8.1 g/dL   Albumin 4.0 3.5 - 5.0 g/dL   AST 20 15 - 41 U/L   ALT 16 14 - 54 U/L   Alkaline Phosphatase 41 38 - 126 U/L   Total Bilirubin 0.7 0.3 - 1.2 mg/dL   GFR calc non Af Amer >60 >60 mL/min   GFR calc Af Amer >60 >60 mL/min   Anion gap 4 (L) 5 - 15    Imaging:  No results found.  MAU Course/MDM: I have ordered labs as follows:  See above, CBC, CMET, Hepatitis studies Imaging ordered: none Results reviewed.   Gave her zofran for nausea which she states did relieve her nausea.  Recheck found her to be sleeping.  No vomiting while here. Tolerates water well. No ketones in urine Pt stable at time of discharge.  Assessment: Probable gastroenteritis Chronic low back pain with reported + history of DDD in L5 space. History of IV drug abuse  Plan: Discharge home Recommend advance diet as tolerated.   Referral phone numbers given for [facility] as well as [facility] (has orange card) Rx sent for Zofran for N/V Rx sent for Flexeril for back pain      Medication List    ASK your doctor about these medications        cephALEXin 500 MG capsule  Commonly known as:  KEFLEX  Take 1 capsule (500 mg total) by  mouth 4 (four) times daily.     methadone 10 MG/ML solution  Commonly known as:  DOLOPHINE  Take 120 mg by mouth daily.       Encouraged to return  to Urgent Care/ED if she develops worsening of symptoms, increase in pain, fever, or other concerning symptoms.   Wynelle BourgeoisMarie Amee Boothe CNM, MSN Certified Nurse-Midwife 04/07/2016 2:34 PM

## 2016-04-07 NOTE — MAU Note (Signed)
Pt presents to MAU with complaints of lower back pain, nausea and vomiting. Pt states that she thought she had an allergy to milk and stopped drinking it but then stared vomiting again today. 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

## 2016-04-08 LAB — HEPATITIS C ANTIBODY: HCV Ab: <0.1 s/co ratio (ref 0.0–0.9)

## 2016-04-08 LAB — HEPATITIS A ANTIBODY, IGM: HEP A IGM: NEGATIVE

## 2016-04-08 LAB — HIV ANTIBODY (ROUTINE TESTING W REFLEX): HIV Screen 4th Generation wRfx: NONREACTIVE

## 2016-04-13 ENCOUNTER — Encounter: Payer: Self-pay | Admitting: Advanced Practice Midwife

## 2016-04-13 DIAGNOSIS — F1911 Other psychoactive substance abuse, in remission: Secondary | ICD-10-CM | POA: Insufficient documentation

## 2016-07-27 ENCOUNTER — Emergency Department (HOSPITAL_COMMUNITY): Payer: Self-pay

## 2016-07-27 ENCOUNTER — Encounter (HOSPITAL_COMMUNITY): Payer: Self-pay | Admitting: Emergency Medicine

## 2016-07-27 ENCOUNTER — Emergency Department (HOSPITAL_COMMUNITY)
Admission: EM | Admit: 2016-07-27 | Discharge: 2016-07-27 | Disposition: A | Discharge status: Home / Self Care | Payer: Self-pay | Attending: Emergency Medicine | Admitting: Emergency Medicine

## 2016-07-27 ENCOUNTER — Ambulatory Visit (HOSPITAL_COMMUNITY): Admission: EM | Admit: 2016-07-27 | Discharge: 2016-07-27 | Discharge status: Absent without leave | Payer: Self-pay

## 2016-07-27 DIAGNOSIS — Z79899 Other long term (current) drug therapy: Secondary | ICD-10-CM | POA: Insufficient documentation

## 2016-07-27 DIAGNOSIS — L02216 Cutaneous abscess of umbilicus: Secondary | ICD-10-CM | POA: Insufficient documentation

## 2016-07-27 DIAGNOSIS — N72 Inflammatory disease of cervix uteri: Secondary | ICD-10-CM

## 2016-07-27 DIAGNOSIS — Z87891 Personal history of nicotine dependence: Secondary | ICD-10-CM | POA: Insufficient documentation

## 2016-07-27 LAB — URINALYSIS, ROUTINE W REFLEX MICROSCOPIC
Bilirubin Urine: NEGATIVE
Glucose, UA: NEGATIVE mg/dL
Hgb urine dipstick: NEGATIVE
Ketones, ur: NEGATIVE mg/dL
NITRITE: NEGATIVE
PH: 6 (ref 5.0–8.0)
PROTEIN: NEGATIVE mg/dL
Specific Gravity, Urine: 1.026 (ref 1.005–1.030)

## 2016-07-27 LAB — COMPREHENSIVE METABOLIC PANEL
ALT: 16 U/L (ref 14–54)
ANION GAP: 9 (ref 5–15)
AST: 19 U/L (ref 15–41)
Albumin: 4.1 g/dL (ref 3.5–5.0)
Alkaline Phosphatase: 64 U/L (ref 38–126)
BUN: 10 mg/dL (ref 6–20)
CO2: 26 mmol/L (ref 22–32)
Calcium: 9.6 mg/dL (ref 8.9–10.3)
Chloride: 103 mmol/L (ref 101–111)
Creatinine, Ser: 0.82 mg/dL (ref 0.44–1.00)
GFR calc Af Amer: >60 mL/min (ref >60)
Glucose, Bld: 89 mg/dL (ref 65–99)
Potassium: 4.4 mmol/L (ref 3.5–5.1)
Sodium: 138 mmol/L (ref 135–145)
Total Bilirubin: 0.7 mg/dL (ref 0.3–1.2)
Total Protein: 8 g/dL (ref 6.5–8.1)

## 2016-07-27 LAB — CBC
HEMATOCRIT: 40.1 % (ref 36.0–46.0)
Hemoglobin: 14 g/dL (ref 12.0–15.0)
MCH: 28.6 pg (ref 26.0–34.0)
MCHC: 34.9 g/dL (ref 30.0–36.0)
MCV: 81.8 fL (ref 78.0–100.0)
PLATELETS: 234 10*3/uL (ref 150–400)
RBC: 4.9 MIL/uL (ref 3.87–5.11)
RDW: 12.2 % (ref 11.5–15.5)
WBC: 5.9 10*3/uL (ref 4.0–10.5)

## 2016-07-27 LAB — LIPASE, BLOOD: Lipase: 31 U/L (ref 11–51)

## 2016-07-27 LAB — URINE MICROSCOPIC-ADD ON: RBC / HPF: NONE SEEN RBC/hpf (ref 0–5)

## 2016-07-27 LAB — WET PREP, GENITAL
CLUE CELLS WET PREP: NONE SEEN
SPERM: NONE SEEN
Yeast Wet Prep HPF POC: NONE SEEN

## 2016-07-27 LAB — POC URINE PREG, ED: PREG TEST UR: NEGATIVE

## 2016-07-27 MED ORDER — METRONIDAZOLE 500 MG PO TABS: 500.0000 mg | ORAL_TABLET | Freq: Two times a day (BID) | ORAL | 0 refills | Status: AC

## 2016-07-27 MED ORDER — ONDANSETRON HCL 4 MG/2ML IJ SOLN
4.0000 mg | Freq: Once | INTRAMUSCULAR | Status: AC
  Administered 2016-07-27: 4 mg via INTRAVENOUS
  Filled 2016-07-27: qty 2

## 2016-07-27 MED ORDER — DEXTROSE 5 % IV SOLN
250.0000 mg | Freq: Once | INTRAVENOUS | Status: AC
Start: 2016-07-27 — End: 2016-07-27
  Administered 2016-07-27: 250 mg via INTRAVENOUS
  Filled 2016-07-27: qty 250

## 2016-07-27 MED ORDER — IOPAMIDOL (ISOVUE-300) INJECTION 61%
INTRAVENOUS | Status: AC
  Administered 2016-07-27: 100 mL
  Filled 2016-07-27: qty 100

## 2016-07-27 MED ORDER — KETOROLAC TROMETHAMINE 30 MG/ML IJ SOLN
30.0000 mg | Freq: Once | INTRAMUSCULAR | Status: AC
  Administered 2016-07-27: 30 mg via INTRAVENOUS
  Filled 2016-07-27: qty 1

## 2016-07-27 MED ORDER — AZITHROMYCIN 250 MG PO TABS
1000.0000 mg | ORAL_TABLET | Freq: Once | ORAL | Status: AC
  Administered 2016-07-27: 1000 mg via ORAL
  Filled 2016-07-27: qty 4

## 2016-07-27 MED ORDER — CLINDAMYCIN HCL 150 MG PO CAPS: 300.0000 mg | ORAL_CAPSULE | Freq: Four times a day (QID) | ORAL | 0 refills | Status: AC

## 2016-07-27 NOTE — ED Provider Notes (Signed)
[facility]-EMER[facility]ENCY DEPT Provider Note   CSN: [id number] Arrival date & time: 07/27/16  1010     History   Chief Complaint Chief Complaint  Patient presents with  . Abdominal Pain    HPI Paige Malone is a 35 y.o. female.  35yo F w/ PMH including substance abuse who p/w abdominal pain. The patient reports a few weeks history of pain at her umbilicus. Recently she began noticing some brown drainage from her umbilicus. She reports associated nausea but no vomiting or diarrhea. She has had problems with urinary urgency and incontinence for the past several months. Recently she has noticed some vaginal discharge. She denies any sexual activity recently. She has a distant history of cholecystectomy several years ago but did not have any complications with the surgery. 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 She has chronic back pain and sciatica symptoms but states that these have not changed recently. No bowel incontinence, saddle anesthesia, or leg weakness.   The history is provided by the patient.  Abdominal Pain      Past Medical History:  Diagnosis Date  . Medical history non-contributory     Patient Active Problem List   Diagnosis Date Noted  . History of drug abuse 04/13/2016    Past Surgical History:  Procedure Laterality Date  . ABSCESS DRAINAGE    . arm surgery    . CHOLECYSTECTOMY      OB History    No data available       Home Medications    Prior to Admission medications   Medication Sig Start Date End Date Taking? Authorizing Provider  methadone (DOLOPHINE) 10 MG/ML solution Take 81 mg by mouth daily.    Yes Historical Provider, MD  oxcarbazepine (TRILEPTAL) 600 MG tablet Take 600 mg by mouth 2 (two) times daily.   Yes Historical Provider, MD  cephALEXin (KEFLEX) 500 MG capsule Take 1 capsule (500 mg total) by mouth 4 (four) times daily. Patient not taking: Reported on 07/27/2016 09/18/14   Everlene FarrierWilliam Dansie, PA-C  cyclobenzaprine (FLEXERIL) 10 MG tablet Take 1  tablet (10 mg total) by mouth 3 (three) times daily as needed for muscle spasms. Patient not taking: Reported on 07/27/2016 04/07/16   Aviva SignsMarie L Williams, CNM  ondansetron (ZOFRAN) 4 MG tablet Take 1 tablet (4 mg total) by mouth every 8 (eight) hours as needed for nausea or vomiting. Patient not taking: Reported on 07/27/2016 04/07/16   Aviva SignsMarie L Williams, CNM    Family History History reviewed. No pertinent family history.  Social History Social History  Substance Use Topics  . Smoking status: Former Smoker    Quit date: 04/18/2014  . Smokeless tobacco: Current User    Types: Chew  . Alcohol use No     Allergies   Sulfa antibiotics   Review of Systems Review of Systems  Gastrointestinal: Positive for abdominal pain.   10 Systems reviewed and are negative for acute change except as noted in the HPI.   Physical Exam Updated Vital Signs BP 111/80   Pulse 71   Temp 98.1 F (36.7 C) (Oral)   Resp 16   Ht 5\' 4"  (1.626 m)   Wt 175 lb (79.4 kg)   SpO2 98%   BMI 30.04 kg/m   Physical Exam  Constitutional: She is oriented to person, place, and time. She appears well-developed and well-nourished. No distress.  HENT:  Head: Normocephalic and atraumatic.  Moist mucous membranes  Eyes: Conjunctivae are normal. Pupils are equal, round, and reactive  to light.  Neck: Neck supple.  Cardiovascular: Normal rate, regular rhythm and normal heart sounds.   No murmur heard. Pulmonary/Chest: Effort normal and breath sounds normal.  Abdominal: Soft. Bowel sounds are normal. She exhibits no distension. There is tenderness.  Tenderness at umbilicus with no obvious erythema or drainage  Genitourinary:  Genitourinary Comments: White frothy discharge in vaginal vault with strawberry cervix, friable cervix; no cervical motion or adnexal tenderness  Musculoskeletal: She exhibits no edema.  Neurological: She is alert and oriented to person, place, and time.  Fluent speech Normal gait, 5/5 strength  BLE  Skin: Skin is warm and dry. No erythema.  Psychiatric: She has a normal mood and affect. Judgment normal.  Nursing note and vitals reviewed.    ED Treatments / Results  Labs (all labs ordered are listed, but only abnormal results are displayed) Labs Reviewed  URINALYSIS, ROUTINE W REFLEX MICROSCOPIC (NOT AT [facility]) - Abnormal; Notable for the following:       Result Value   APPearance CLOUDY (*)    Leukocytes, UA SMALL (*)    All other components within normal limits  URINE MICROSCOPIC-ADD ON - Abnormal; Notable for the following:    Squamous Epithelial / LPF 0-5 (*)    Bacteria, UA FEW (*)    All other components within normal limits  WET PREP, GENITAL  LIPASE, BLOOD  COMPREHENSIVE METABOLIC PANEL  CBC  RPR  HIV ANTIBODY (ROUTINE TESTING)  POC URINE PREG, ED  GC/CHLAMYDIA PROBE AMP ([place]) NOT AT [facility]    EKG  EKG Interpretation None       Radiology Ct Abdomen Pelvis W Contrast  Result Date: 07/27/2016 CLINICAL DATA:  Umbilical pain and drainage. History of lap cholecystectomy. History IV drug abuse. EXAM: CT ABDOMEN AND PELVIS WITH CONTRAST TECHNIQUE: Multidetector CT imaging of the abdomen and pelvis was performed using the standard protocol following bolus administration of intravenous contrast. CONTRAST:  100mL ISOVUE-300 IOPAMIDOL (ISOVUE-300) INJECTION 61% COMPARISON:  None. FINDINGS: Lower chest: Lung bases clear Hepatobiliary: Liver normal. Cholecystectomy clips. Mild prominence of the intrahepatic bile ducts. Common bile duct measures approximately 9 mm in diameter which is upper normal. Correlate with liver function tests. Pancreas: Negative Spleen: Negative Adrenals/Urinary Tract: Symmetric renal enhancement. No obstruction or mass. No urinary tract calculi. Normal urinary bladder. Stomach/Bowel: Stomach and duodenum appear normal. No bowel obstruction. No bowel mass or edema. Normal appendix. Vascular/Lymphatic: Negative Reproductive: Negative Other: Soft  tissue thickening of the umbilicus. There is mild stranding in the surrounding fat suggesting acute infection. There is a possible small rim enhancing fluid collection in the umbilicus which may be a small abscess under 1 cm. Musculoskeletal: Negative.  No fracture or discitis identified. IMPRESSION: Postop cholecystectomy. Prominent bile ducts likely related to cholecystectomy. Common bile duct measures 9 mm. The patient currently has normal liver function test. Mild-to-moderate soft tissue thickening and edema around the umbilicus with a probable small sub cm abscess. Electronically Signed   By: Marlan Palauharles  Clark M.D.   On: 07/27/2016 13:05    Procedures Procedures (including critical care time)  Medications Ordered in ED Medications  ondansetron (ZOFRAN) injection 4 mg (not administered)  ketorolac (TORADOL) 30 MG/ML injection 30 mg (not administered)  azithromycin (ZITHROMAX) tablet 1,000 mg (not administered)  cefTRIAXone (ROCEPHIN) 250 mg in dextrose 5 % 50 mL IVPB (not administered)  iopamidol (ISOVUE-300) 61 % injection (100 mLs  Contrast Given 07/27/16 1238)     Initial Impression / Assessment and Plan / ED Course  I have reviewed the triage vital signs and the nursing notes.  Pertinent labs & imaging results that were available during my care of the patient were reviewed by me and considered in my medical decision making (see chart for details).  Clinical Course    Patient with at least 1 week of pain at her umbilicus, recent discharge, as well as several months of problems with urinary incontinence. She had tenderness at the umbilicus on exam without obvious redness or drainage. Pelvic exam showed vaginal discharge and friable cervix suggestive of cervicitis without evidence of PID. Labs show Trichomonas in the urine, otherwise unremarkable. Obtained CT of the abdomen and pelvis to evaluate for infectious process or possible postsurgical complication. CT showed soft tissue skin  thickening and small abscess around the umbilicus.  She has no signs or symptoms to suggest cauda equina and her urinary incontinence has lasted for several months. I considered spinal process but feel this is unlikely given her well appearance and normal strength of her legs.  Contacted general surgery, Will and Brooke came to see the patient. I appreciate their assistance with the patient's care. They staffed with attending, and we all agreed that based on the location it would be difficult to find a drainable pocket. Given that the area is so small and the patient has already noticed some drainage, they recommended antibiotics and follow-up in the clinic. I discussed this plan with the patient and I extensively reviewed return precautions. She voiced understanding of plan and was discharged in satisfactory condition.   Final Clinical Impressions(s) / ED Diagnoses   Final diagnoses:  None    New Prescriptions New Prescriptions   No medications on file     Laurence Spates, MD 07/27/16 1512

## 2016-07-27 NOTE — Discharge Instructions (Signed)
DO NOT DRINK ALCOHOL WHILE TAKING THESE MEDICATIONS. RETURN IMMEDIATELY IF ANY OF YOUR SYMPTOMS WORSEN OR IF YOU DEVELOP FEVER.

## 2016-07-27 NOTE — ED Triage Notes (Signed)
Pt from home with c/o umbilical pain x 1 week with brown drainage with aching throbbing feeling and abdominal discomfort.  Pt reports location is the same place as her surgical site from gallbladder removal years ago.  Pt additionally reports urinary incontinence.  NAD, A&O.

## 2016-07-27 NOTE — ED Notes (Signed)
Patient transported to CT 

## 2016-07-28 LAB — GC/CHLAMYDIA PROBE AMP (~~LOC~~) NOT AT ARMC
CHLAMYDIA, DNA PROBE: NEGATIVE
NEISSERIA GONORRHEA: NEGATIVE

## 2016-07-28 LAB — HIV ANTIBODY (ROUTINE TESTING W REFLEX): HIV SCREEN 4TH GENERATION: NONREACTIVE

## 2016-07-28 LAB — RPR: RPR Ser Ql: NONREACTIVE

## 2018-04-04 IMAGING — CT CT ABD-PELV W/ CM
2 of 4 series · 11 of 46 positions shown, 12 images · IV contrast (Iodine)
Comparison: None.

CLINICAL DATA: Umbilical pain and drainage. History of lap
cholecystectomy. History IV drug abuse.

EXAM:
CT ABDOMEN AND PELVIS WITH CONTRAST
TECHNIQUE: Multidetector CT imaging of the abdomen and pelvis was performed
using the standard protocol following bolus administration of
intravenous contrast.
CONTRAST:  100mL PW2P2B-FPP IOPAMIDOL (PW2P2B-FPP) INJECTION 61%

[Series 201: routine, idose (2) · axial · 0.70mm/px · z∈[+105,+490]mm · 8 of 93 slices shown, 9 images]
[im 8/93  soft-tissue]
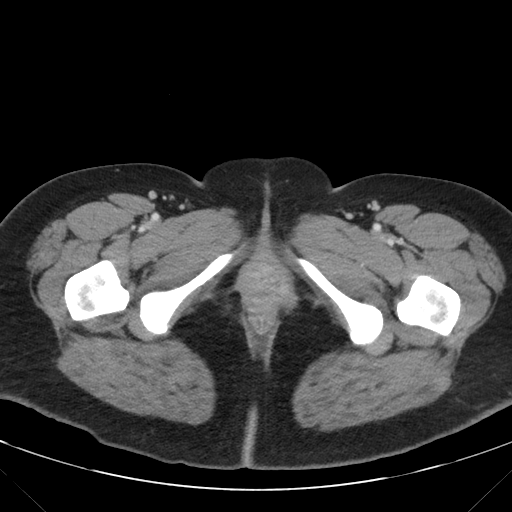
[im 8/93  bone]
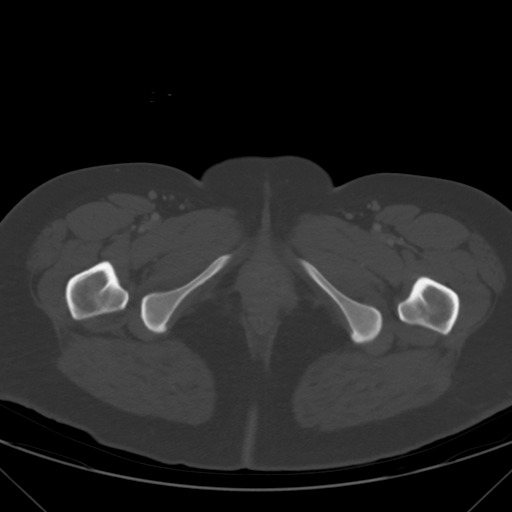
[im 20/93  soft-tissue]
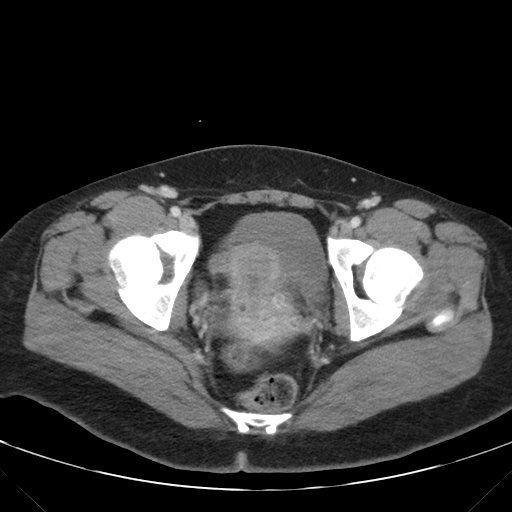
[im 31/93  soft-tissue]
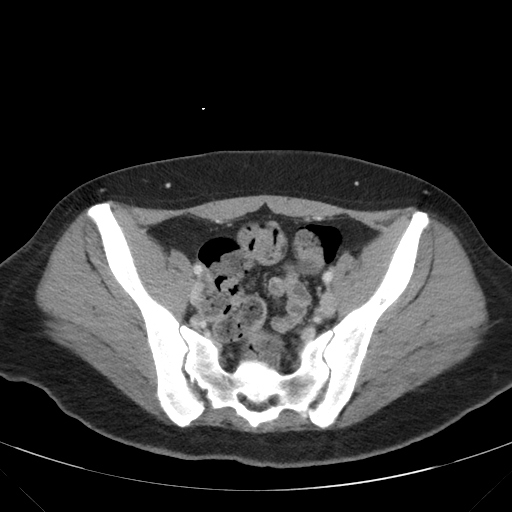
[im 43/93  soft-tissue]
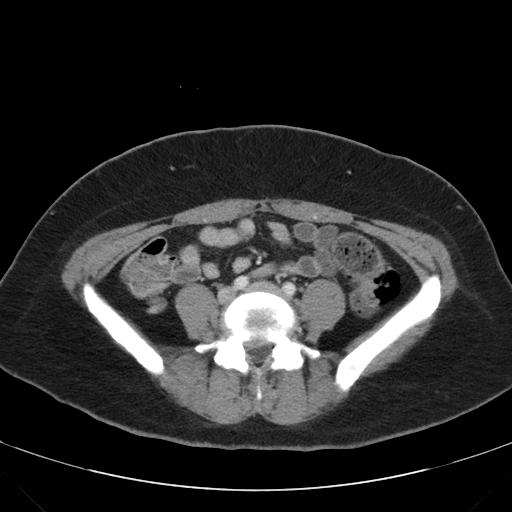
[im 50/93  soft-tissue]
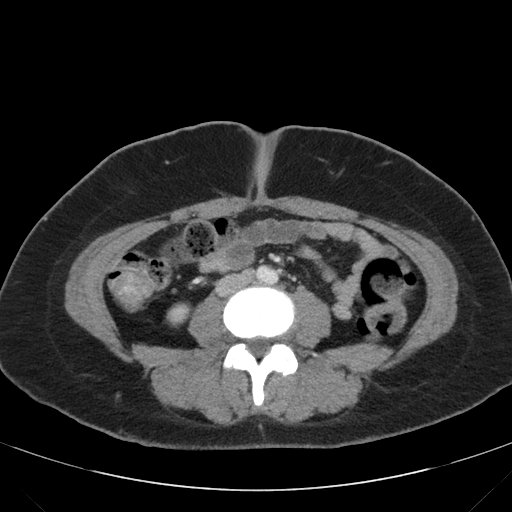
[im 62/93  soft-tissue]
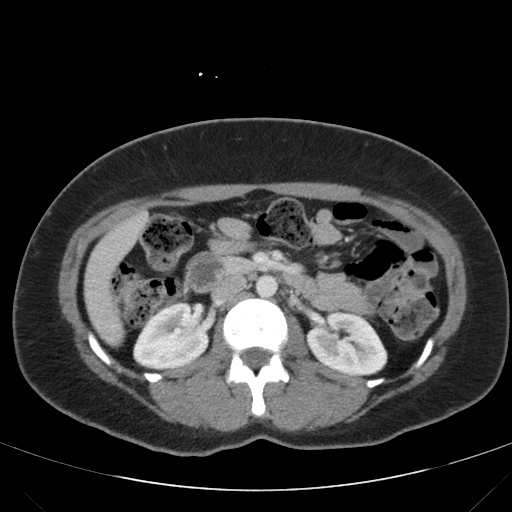
[im 73/93  soft-tissue]
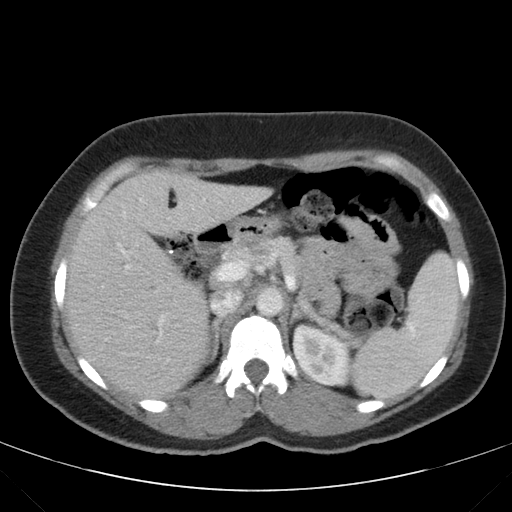
[im 85/93  soft-tissue]
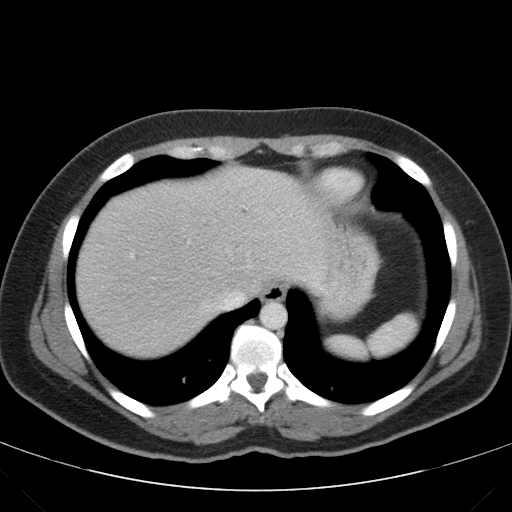

[Series 203: coronals, idose (2) · coronal · 0.45mm/px · 3 of 117 slices shown]
[im 39/117  soft-tissue]
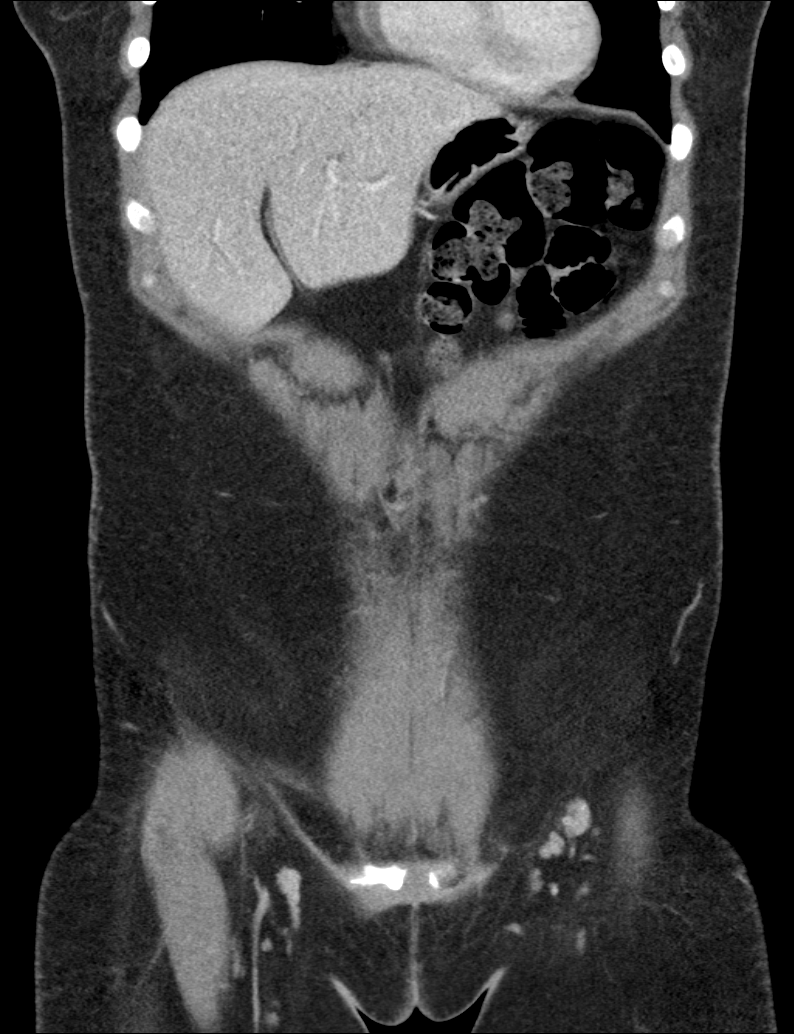
[im 52/117  soft-tissue]
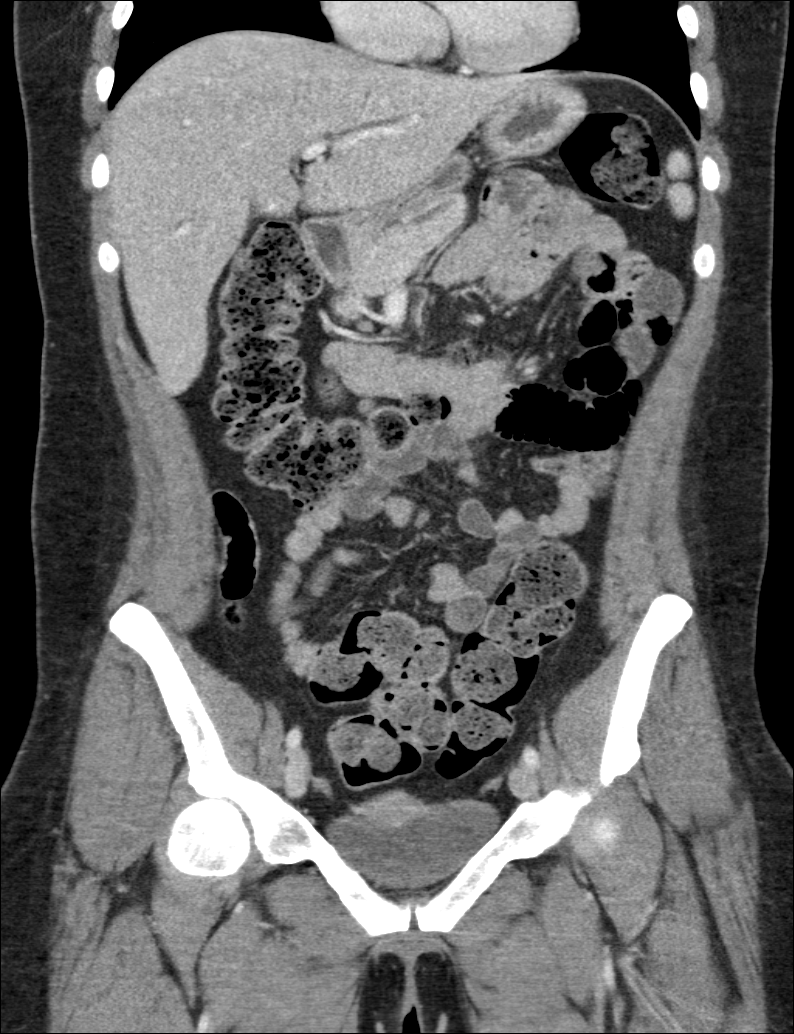
[im 65/117  soft-tissue]
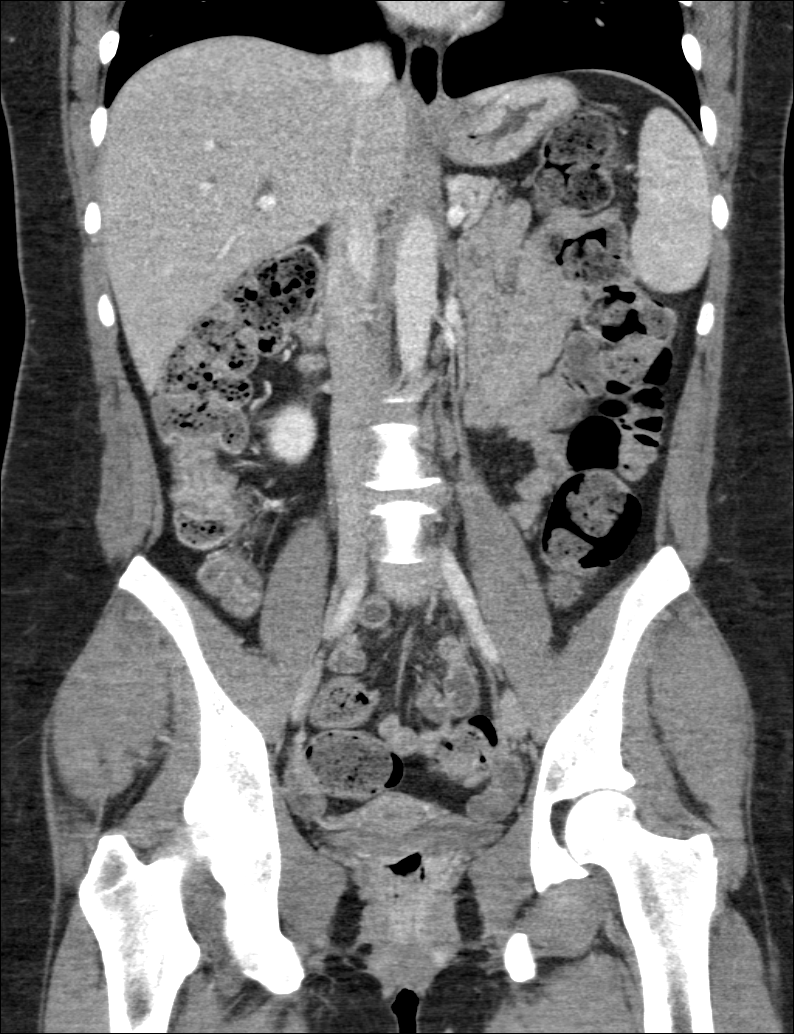

[11 of 46 positions shown; findings below may reference images not displayed]

FINDINGS: Lower chest: Lung bases clear

Hepatobiliary: Liver normal. Cholecystectomy clips. Mild prominence
of the intrahepatic bile ducts. Common bile duct measures
approximately 9 mm in diameter which is upper normal. Correlate with
liver function tests.

Pancreas: Negative

Spleen: Negative

Adrenals/Urinary Tract: Symmetric renal enhancement. No obstruction
or mass. No urinary tract calculi. Normal urinary bladder.

Stomach/Bowel: Stomach and duodenum appear normal. No bowel
obstruction. No bowel mass or edema. Normal appendix.

Vascular/Lymphatic: Negative

Reproductive: Negative

Other: Soft tissue thickening of the umbilicus. There is mild
stranding in the surrounding fat suggesting acute infection. There
is a possible small rim enhancing fluid collection in the umbilicus
which may be a small abscess under 1 cm.

Musculoskeletal: Negative.  No fracture or discitis identified.
IMPRESSION: Postop cholecystectomy. Prominent bile ducts likely related to
cholecystectomy. Common bile duct measures 9 mm. The patient
currently has normal liver function test.

Mild-to-moderate soft tissue thickening and edema around the
umbilicus with a probable small sub cm abscess.

## 2023-01-27 ENCOUNTER — Ambulatory Visit (HOSPITAL_COMMUNITY)
Admission: EM | Admit: 2023-01-27 | Discharge: 2023-01-27 | Disposition: A | Discharge status: Home / Self Care | Payer: No Payment, Other | Attending: Family | Admitting: Family

## 2023-01-27 DIAGNOSIS — F319 Bipolar disorder, unspecified: Secondary | ICD-10-CM | POA: Insufficient documentation

## 2023-01-27 DIAGNOSIS — Z789 Other specified health status: Secondary | ICD-10-CM

## 2023-01-27 NOTE — Progress Notes (Signed)
   01/27/23 0953  [facility] Triage Screening (Walk-ins at [facility] only)  How Did You Hear About [place]? Other (Comment) [facility])  What Is the Reason for Your Visit/Call Today? Paige Malone is a 42 y/o female presenting to the [facility]. States that she has a diagnosis of Bipolar Disorder with manic episodes. Currently, prescribed methadone (58mg ). Her methadone maintenance has been managed by [facility] (6 months). However, she has been prescribed methadone for 9 years.  Patient says that her provider at the current treatment center would like to decrease her methadone dosage. However, will not start that process until patient is prescribed Bipolar management medications. Patient requesting a prescription for Bipolar Disorder management. Previously, prescribed Lithium. Says that this medication worked for 6 months, however, she started to have headaches (migraine) and discontinued the medication on her own. Her last dosage of lithium was 2 months ago. Patient denies SI, HI, and AVH's. Hx of opioid abuse and has been sober for 9 years. Currently, she smokes Delta 8. Denies alcohol use. Patient enrolled in therapy at the [facility]. No hx of psychiatric treatment. However, completed treatment at [facility] in past. Patient lives with her finance of 12 years. 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  How Long Has This Been Causing You Problems? > than 6 months  Have You Recently Had Any Thoughts About Hurting Yourself? No  Are You Planning to Commit Suicide/Harm Yourself At This time? No  Have you Recently Had Thoughts About Hurting Someone Paige Malone? No  Are You Planning To Harm Someone At This Time? No  Are you currently experiencing any auditory, visual or other hallucinations? No  Have You Used Any Alcohol or Drugs in the Past 24 Hours? Yes  How long ago did you use Drugs or Alcohol? Delta 8 - 01/26/2023  What Did You Use and How Much? Delta 8, "A  couple of puffs of the vape pin".  Do you have any current medical co-morbidities that require immediate attention? No  Clinician description of patient physical appearance/behavior: Patient is anxious, talkative, polite, calm, and cooperative.  What Do You Feel Would Help You the Most Today? Treatment for Depression or other mood problem;Medication(s);Stress Management  If access to [facility] was not available, would you have sought care in the Emergency Department? No  Determination of Need Routine (7 days)  Options For Referral Medication Management;Outpatient Therapy;Chemical Dependency Intensive Outpatient Therapy (CDIOP)

## 2023-01-27 NOTE — Discharge Instructions (Addendum)

## 2023-01-27 NOTE — ED Provider Notes (Signed)
Behavioral Health Urgent Care Medical Screening Exam  Patient Name: Paige Malone MRN: [id number] Date of Evaluation: 01/27/23 Chief Complaint:   Diagnosis:  Final diagnoses:  Needs assistance with community resources    History of Present illness: Paige Malone is a 42 y.o. female. Patient presents voluntarily to [facility] behavioral health for walk-in assessment.   Patient is assessed, face-to-face, by nurse practitioner. She is seated in assessment area, no acute distress. Consulted with provider, Dr.  Lucianne Muss, and chart reviewed on 01/27/2023. She  is alert and oriented, pleasant and cooperative during assessment.   Kassy is seeking outpatient psychiatry for medication management.  She has been prescribed methadone for 9 years and would like to be tapered from methadone, methadone prescriber insist that patient be managed for history of bipolar disorder prior to initiating taper.  Patient states "I am ready to have my whole life back, it is time."  She reports she has been diagnosed with bipolar disorder.  She stopped her lithium approximately 2 months ago related to side effects including increased headaches.  No current medications.  She is not linked with outpatient psychiatry medication management.  She meets with outpatient individual counselor weekly at [facility] treatment [facility] methadone clinic she denies history of inpatient psychiatric hospitalization.  Family mental health history includes patient's mother who has been diagnosed with bipolar disorder.  Patient  presents with euthymic mood, congruent affect. She  denies suicidal and homicidal ideations. Denies history of suicide attempts, denies history of self-harm.  Patient easily  contracts verbally for safety with this Clinical research associate.    Patient has normal speech and behavior.  She  denies auditory and visual hallucinations.  Patient is able to converse coherently with goal-directed thoughts and no distractibility or  preoccupation.  Denies symptoms of paranoia.  Objectively there is no evidence of psychosis/mania or delusional thinking.  Paige Malone resides in [place].  She does have access to weapons in her home.  She is employed in [employer], dispensing.  Patient endorses average sleep and appetite.  She denies alcohol and substance use.  History of substance use disorder.  She was admitted to [facility] 3 times in the distant past.  Patient offered support and encouragement.  She declines any person to contact for collateral information at this time.   Patient educated and verbalizes understanding of mental health resources and other crisis services in the community. They are instructed to call 911 and present to the nearest emergency room should patient experience any suicidal/homicidal ideation, auditory/visual/hallucinations, or detrimental worsening of mental health condition.      Flowsheet Row ED from 01/27/2023 in [facility]  C-SSRS RISK CATEGORY No Risk       Psychiatric Specialty Exam  Presentation  General Appearance:Appropriate for Environment; Casual  Eye Contact:Good  Speech:Clear and Coherent; Normal Rate  Speech Volume:Normal  Handedness:Right   Mood and Affect  Mood: Euthymic  Affect: Appropriate; Congruent   Thought Process  Thought Processes: Coherent; Goal Directed; Linear  Descriptions of Associations:Intact  Orientation:Full (Time, Place and Person)  Thought Content:Logical; WDL    Hallucinations:None  Ideas of Reference:None  Suicidal Thoughts:No  Homicidal Thoughts:No   Sensorium  Memory: Immediate Good; Recent Good  Judgment: Good  Insight: Good   Executive Functions  Concentration: Good  Attention Span: Good  Recall: Good  Fund of Knowledge: Good  Language: Good   Psychomotor Activity  Psychomotor Activity: Normal   Assets  Assets: Communication Skills; Desire for Improvement; Financial  Resources/Insurance; Housing; Leisure  Time; Physical Health; Resilience; Social Support   Sleep  Sleep: Good  Number of hours: No data recorded  Physical Exam: Physical Exam Vitals and nursing note reviewed.  Constitutional:      Appearance: Normal appearance. She is well-developed.  HENT:     Head: Normocephalic and atraumatic.     Nose: Nose normal.  Cardiovascular:     Rate and Rhythm: Normal rate.  Pulmonary:     Effort: Pulmonary effort is normal.  Musculoskeletal:        General: Normal range of motion.     Cervical back: Normal range of motion.  Skin:    General: Skin is warm and dry.  Neurological:     Mental Status: She is alert and oriented to person, place, and time.  Psychiatric:        Attention and Perception: Attention and perception normal.        Mood and Affect: Mood and affect normal.        Speech: Speech normal.        Behavior: Behavior normal. Behavior is cooperative.        Thought Content: Thought content normal.        Cognition and Memory: Cognition and memory normal.    Review of Systems  Constitutional: Negative.   HENT: Negative.    Eyes: Negative.   Respiratory: Negative.    Cardiovascular: Negative.   Gastrointestinal: Negative.   Genitourinary: Negative.   Musculoskeletal: Negative.   Skin: Negative.   Neurological: Negative.   Psychiatric/Behavioral: Negative.     Blood pressure 120/80, pulse 78, temperature 98.2 F (36.8 C), temperature source Oral, resp. rate 18, SpO2 99 %. There is no height or weight on file to calculate BMI.  Musculoskeletal: Strength & Muscle Tone: within normal limits Gait & Station: normal Patient leans: N/A   [facility] MSE Discharge Disposition for Follow up and Recommendations: Based on my evaluation the patient does not appear to have an emergency medical condition and can be discharged with resources and follow up care in outpatient services for Medication Management and Individual Therapy Follow up  with outpatient psychiatry, resources provided. Plan to follow up at [facility] for walk-in hours on 01/28/2023.  Lenard Lance, FNP 01/27/2023, 10:30 AM

## 2023-02-02 ENCOUNTER — Ambulatory Visit (INDEPENDENT_AMBULATORY_CARE_PROVIDER_SITE_OTHER): Payer: Self-pay | Admitting: Student in an Organized Health Care Education/Training Program

## 2023-02-02 VITALS — BP 121/51 | HR 60 | Resp 16 | Wt 202.0 lb

## 2023-02-02 DIAGNOSIS — F4010 Social phobia, unspecified: Secondary | ICD-10-CM

## 2023-02-02 DIAGNOSIS — F411 Generalized anxiety disorder: Secondary | ICD-10-CM

## 2023-02-02 DIAGNOSIS — F319 Bipolar disorder, unspecified: Secondary | ICD-10-CM

## 2023-02-02 MED ORDER — QUETIAPINE FUMARATE 100 MG PO TABS: 100.0000 mg | ORAL_TABLET | Freq: Every day | ORAL | 1 refills | Status: AC
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
## 2023-03-10 ENCOUNTER — Ambulatory Visit (HOSPITAL_COMMUNITY): Payer: No Payment, Other | Admitting: Student
